# Patient Record
Sex: Female | Born: 1960 | Race: White | Hispanic: No | Marital: Single | Smoking: Never smoker
Health system: Southern US, Community
[De-identification: ages and names within clinical notes are randomized; demographics above are authoritative.]

## PROBLEM LIST (undated history)

## (undated) DIAGNOSIS — J45909 Unspecified asthma, uncomplicated: Secondary | ICD-10-CM

## (undated) DIAGNOSIS — F329 Major depressive disorder, single episode, unspecified: Secondary | ICD-10-CM

## (undated) DIAGNOSIS — F32A Depression, unspecified: Secondary | ICD-10-CM

## (undated) DIAGNOSIS — G4700 Insomnia, unspecified: Secondary | ICD-10-CM

## (undated) HISTORY — DX: Depression, unspecified: F32.A

## (undated) HISTORY — PX: COLONOSCOPY: SHX174

## (undated) HISTORY — PX: FOOT SURGERY: SHX648

## (undated) HISTORY — PX: ESOPHAGOGASTRODUODENOSCOPY: SHX1529

---
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
## 1993-05-03 HISTORY — PX: NISSEN FUNDOPLICATION: SHX2091

## 1998-12-01 ENCOUNTER — Other Ambulatory Visit: Admission: RE | Admit: 1998-12-01 | Discharge: 1998-12-01 | Payer: Self-pay | Admitting: *Deleted

## 2000-01-26 ENCOUNTER — Other Ambulatory Visit: Admission: RE | Admit: 2000-01-26 | Discharge: 2000-01-26 | Payer: Self-pay | Admitting: *Deleted

## 2001-01-19 ENCOUNTER — Other Ambulatory Visit: Admission: RE | Admit: 2001-01-19 | Discharge: 2001-01-19 | Payer: Self-pay | Admitting: Obstetrics and Gynecology

## 2002-06-04 ENCOUNTER — Other Ambulatory Visit: Admission: RE | Admit: 2002-06-04 | Discharge: 2002-06-04 | Payer: Self-pay | Admitting: Obstetrics and Gynecology

## 2003-06-24 ENCOUNTER — Other Ambulatory Visit: Admission: RE | Admit: 2003-06-24 | Discharge: 2003-06-24 | Payer: Self-pay | Admitting: Obstetrics and Gynecology

## 2015-07-28 ENCOUNTER — Ambulatory Visit
Admission: EM | Admit: 2015-07-28 | Discharge: 2015-07-28 | Disposition: A | Discharge status: Home / Self Care | Payer: Federal, State, Local not specified - PPO | Attending: Emergency Medicine | Admitting: Emergency Medicine

## 2015-07-28 ENCOUNTER — Encounter: Payer: Self-pay | Admitting: Emergency Medicine

## 2015-07-28 DIAGNOSIS — J0140 Acute pansinusitis, unspecified: Secondary | ICD-10-CM

## 2015-07-28 MED ORDER — MOMETASONE FUROATE 50 MCG/ACT NA SUSP: 2.0000 | Freq: Every day | NASAL | Status: DC

## 2015-07-28 MED ORDER — IBUPROFEN 800 MG PO TABS: 800.0000 mg | ORAL_TABLET | Freq: Three times a day (TID) | ORAL | Status: DC | PRN

## 2015-07-28 MED ORDER — PSEUDOEPHEDRINE-GUAIFENESIN ER 120-1200 MG PO TB12: 1.0000 | ORAL_TABLET | Freq: Two times a day (BID) | ORAL | Status: DC

## 2015-07-28 MED ORDER — AMOXICILLIN-POT CLAVULANATE 875-125 MG PO TABS: 1.0000 | ORAL_TABLET | Freq: Two times a day (BID) | ORAL | Status: DC

## 2015-07-28 NOTE — ED Notes (Signed)
Developed sinus pain and sore throat last Wednesday. Has drainage at night

## 2015-07-28 NOTE — ED Provider Notes (Signed)
HPI  SUBJECTIVE:  Claudina LickLori Kocourek is a 55 y.o. female who presents with sore throat, swollen glands, ears "popping", bilateral ear pain, nasal congestion, postnasal drip, frontal maxillary sinus pain/pressure for 6 days. States that she had upper respiratory like symptoms 3 weeks ago, and that she got slightly better but then got worse again 6 days ago. She states that her upper teeth hurt. Symptoms are worse with lying down, no alleviating factors. No nausea, vomiting, fevers, rhinorrhea. No otorrhea, hearing changes. No allergy symptoms. Reports occasional cough but no wheezing or shortness of breath. Reports raspy voice, no muffled hot potato voice. No drooling, trismus, neck stiffness. No antipyretic in past 4-6 hours. Symptoms are better with ice packs, worse with lying down. She has also tried heat on her face. No known contacts with strep or flu. 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 States that she usually gets a sore throat with sinus infections. No history of diabetes or hypertension. PMD: none  History reviewed. No pertinent past medical history.  Past Surgical History  Procedure Laterality Date  . Foot surgery      History reviewed. No pertinent family history.  Social History  Substance Use Topics  . Smoking status: Never Smoker   . Smokeless tobacco: None  . Alcohol Use: Yes    No current facility-administered medications for this encounter.  Current outpatient prescriptions:  .  desvenlafaxine (PRISTIQ) 100 MG 24 hr tablet, Take 100 mg by mouth daily., Disp: , Rfl:  .  zolpidem (AMBIEN) 10 MG tablet, Take 10 mg by mouth at bedtime as needed for sleep., Disp: , Rfl:  .  amoxicillin-clavulanate (AUGMENTIN) 875-125 MG tablet, Take 1 tablet by mouth 2 (two) times daily. X 7 days, Disp: 14 tablet, Rfl: 0 .  ibuprofen (ADVIL,MOTRIN) 800 MG tablet, Take 1 tablet (800 mg total) by mouth every 8 (eight) hours as needed., Disp: 30 tablet, Rfl: 0 .   mometasone (NASONEX) 50 MCG/ACT nasal spray, Place 2 sprays into the nose daily., Disp: 17 g, Rfl: 0 .  Pseudoephedrine-Guaifenesin (MUCINEX D) 720 859 6514 MG TB12, Take 1 tablet by mouth 2 (two) times daily., Disp: 20 each, Rfl: 0  No Known Allergies   ROS  As noted in HPI.   Physical Exam  BP 130/72 mmHg  Pulse 74  Temp(Src) 98.4 F (36.9 C) (Oral)  Resp 18  Ht 5\' 5"  (1.651 m)  Wt 185 lb (83.915 kg)  BMI 30.79 kg/m2  SpO2 97%  LMP  (LMP Unknown)  Constitutional: Well developed, well nourished, no acute distress Eyes:  EOMI, conjunctiva normal bilaterally HENT: Normocephalic, atraumatic,mucus membranes moist. TMs normal bilaterally. + purulent nasal congestion. Swollen  red turbinates. +  maxillary sinus tenderness, +  frontal sinus tenderness. Oropharynx erythematous +postnasal drip.  Neck: No cervical lymphadenopathy  Respiratory: Normal inspiratory effort Cardiovascular: Normal rate GI: nondistended skin: No rash, skin intact Musculoskeletal: no deformities Neurologic: Alert & oriented x 3, no focal neuro deficits Psychiatric: Speech and behavior appropriate   ED Course   Medications - No data to display  No orders of the defined types were placed in this encounter.    No results found for this or any previous visit (from the past 24 hour(s)). No results found.  ED Clinical Impression  Acute pansinusitis, recurrence not specified  ED Assessment/Plan   Pt with indications for abx due to double sickening and severe symptoms.. Will start augmentin,  in addition to nasal steriods, mucinex-d, saline nasal irrigation, increase  fluids, tylenol/motrin prn pain. Discussed MDM and plan with pt. Pt agrees with plan and will f/u with PMD of choice. Giving referral to [place] [facility]. *This clinic note was created using Dragon dictation software. Therefore, there may be occasional mistakes despite careful proofreading.  ?      Domenick Gong, MD 07/28/15  [phone]

## 2015-07-28 NOTE — Discharge Instructions (Signed)
Take the medication as written. Return if you get worse, have a fever >100.4, or for any concerns. You may take 800 mg of motrin with 1 gram of tylenol up to 3 times a day as needed for pain. This is an effective combination for pain. Use a neti pot or the NeilMed sinus rinse as often as you want to to reduce nasal congestion. Follow the directions on the box.  ° °Go to www.goodrx.com to look up your medications. This will give you a list of where you can find your prescriptions at the most affordable prices.  ° °

## 2015-12-26 ENCOUNTER — Other Ambulatory Visit: Payer: Self-pay | Admitting: Obstetrics and Gynecology

## 2015-12-26 DIAGNOSIS — Z1231 Encounter for screening mammogram for malignant neoplasm of breast: Secondary | ICD-10-CM

## 2017-01-31 ENCOUNTER — Other Ambulatory Visit: Payer: Self-pay | Admitting: Obstetrics and Gynecology

## 2017-01-31 DIAGNOSIS — Z1231 Encounter for screening mammogram for malignant neoplasm of breast: Secondary | ICD-10-CM

## 2017-12-28 ENCOUNTER — Other Ambulatory Visit: Payer: Self-pay

## 2017-12-28 ENCOUNTER — Ambulatory Visit
Admission: EM | Admit: 2017-12-28 | Discharge: 2017-12-28 | Disposition: A | Discharge status: Home / Self Care | Payer: Federal, State, Local not specified - PPO | Attending: Family Medicine | Admitting: Family Medicine

## 2017-12-28 ENCOUNTER — Encounter: Payer: Self-pay | Admitting: Emergency Medicine

## 2017-12-28 DIAGNOSIS — J0100 Acute maxillary sinusitis, unspecified: Secondary | ICD-10-CM

## 2017-12-28 DIAGNOSIS — H6591 Unspecified nonsuppurative otitis media, right ear: Secondary | ICD-10-CM

## 2017-12-28 HISTORY — DX: Insomnia, unspecified: G47.00

## 2017-12-28 LAB — RAPID STREP SCREEN (MED CTR MEBANE ONLY): STREPTOCOCCUS, GROUP A SCREEN (DIRECT): NEGATIVE

## 2017-12-28 MED ORDER — AMOXICILLIN-POT CLAVULANATE 875-125 MG PO TABS: 1.0000 | ORAL_TABLET | Freq: Two times a day (BID) | ORAL | 0 refills | Status: DC

## 2017-12-28 NOTE — Discharge Instructions (Addendum)
Take medication as prescribed. Rest. Drink plenty of fluids.  ° °Follow up with your primary care physician this week as needed. Return to Urgent care for new or worsening concerns.  ° °

## 2017-12-28 NOTE — ED Provider Notes (Signed)
[facility] ____________________________________________  Time seen: Approximately 10:21 AM  I have reviewed the triage vital signs and the nursing notes.   HISTORY  Chief Complaint Nasal Congestion; Sore Throat; and Lymphadenopathy   HPI Catherine Page is a 57 y.o. female presenting for evaluation of 1 week of nasal congestion, nasal drainage,sore throat, right ear discomfort and right-sided lymph node tenderness.  Patient stated that this started off with sensation of fever, but reports she is also having menopausal hot flashes and unsure if it was truly a fever or hot flashes.  States she then felt the nasal congestion and right-sided tenderness to her neck with ear discomfort.  Continues with nasal drainage.  States occasional cough, but denies chest congestion sensation.  Denies continued fever sensation.  Has been taking over-the-counter cough cold congestion decongestants without resolution.  Denies known sick contacts.  Has continue to remain active.  Decreased appetite, but continues to overall eat and drink well.  Denies other aggravating alleviating factors.  Reports otherwise feels well.  Denies chest pain, shortness of breath, abdominal pain, or rash. Denies recent sickness. Denies recent antibiotic use.    Past Medical History:  Diagnosis Date  . Insomnia     There are no active problems to display for this patient.   Past Surgical History:  Procedure Laterality Date  . FOOT SURGERY       No current facility-administered medications for this encounter.   Current Outpatient Medications:  .  zolpidem (AMBIEN) 10 MG tablet, Take 10 mg by mouth at bedtime as needed for sleep., Disp: , Rfl:  .  amoxicillin-clavulanate (AUGMENTIN) 875-125 MG tablet, Take 1 tablet by mouth every 12 (twelve) hours., Disp: 20 tablet, Rfl: 0  Allergies Patient has no known allergies.  Family History  Problem Relation Age of Onset  . Alzheimer's disease Mother   . Dementia  Mother   . Hypertension Mother   . Diabetes Mother   . Stroke Mother   . Other Father [id number]       committed suicide  . Heart attack Father   . Heart disease Father     Social History Social History   Tobacco Use  . Smoking status: Never Smoker  . Smokeless tobacco: Never Used  Substance Use Topics  . Alcohol use: Yes    Comment: rarely  . Drug use: Never    Review of Systems Constitutional: As above.  ENT: positive sore throat. As above. Cardiovascular: Denies chest pain. Respiratory: Denies shortness of breath. Gastrointestinal: No abdominal pain.  Musculoskeletal: Negative for back pain. Skin: Negative for rash.  ____________________________________________   PHYSICAL EXAM:  VITAL SIGNS: ED Triage Vitals  Enc Vitals Group     BP 12/28/17 0935 128/69     Pulse Rate 12/28/17 0935 76     Resp 12/28/17 0935 16     Temp 12/28/17 0935 98.5 F (36.9 C)     Temp Source 12/28/17 0935 Oral     SpO2 12/28/17 0935 99 %     Weight 12/28/17 0934 170 lb (77.1 kg)     Height 12/28/17 0934 5\' 4"  (1.626 m)     Head Circumference --      Peak Flow --      Pain Score 12/28/17 0934 7     Pain Loc --      Pain Edu? --      Excl. in GC? --     Constitutional: Alert and oriented. Well appearing and in no acute distress. Eyes:  Conjunctivae are normal.  Head: Atraumatic.Mild tenderness to palpation bilateral maxillary sinuses.  No frontal sinus tenderness palpation.  No swelling. No erythema.   Ears: Left: Nontender, normal canal, no erythema, normal TM.  Right: Nontender, normal canal, moderate erythema, effusion present, no drainage.  No mastoid tenderness bilaterally.  Nose: nasal congestion with bilateral nasal turbinate erythema and edema.   Mouth/Throat: Mucous membranes are moist.  Oropharynx non-erythematous.No tonsillar swelling or exudate.  Neck: No stridor.  No cervical spine tenderness to palpation. Hematological/Lymphatic/Immunilogical: Mild right sided cervical  lymphadenopathy, no left. Cardiovascular: Normal rate, regular rhythm. Grossly normal heart sounds.  Good peripheral circulation. Respiratory: Normal respiratory effort.  No retractions.No wheezes, rales or rhonchi. Good air movement.  Musculoskeletal: Steady gait. Neurologic:  Normal speech and language. No gait instability. Skin:  Skin is warm, dry and intact. No rash noted. Psychiatric: Mood and affect are normal. Speech and behavior are normal.  ___________________________________________   LABS (all labs ordered are listed, but only abnormal results are displayed)  Labs Reviewed  RAPID STREP SCREEN ([facility] ONLY)  CULTURE, GROUP A STREP [facility])   ____________________________________________ PROCEDURES Procedures    INITIAL IMPRESSION / ASSESSMENT AND PLAN / ED COURSE  Pertinent labs & imaging results that were available during my care of the patient were reviewed by me and considered in my medical decision making (see chart for details).  Well-appearing patient.  No acute distress.  Strep negative.  Suspect recent viral upper respiratory infection, now with right otitis with effusion and concern for secondary sinusitis.  Will treat with oral Augmentin.  Encouraged rest, fluids, supportive care and decongestants.Discussed indication, risks and benefits of medications with patient.  Discussed follow up with Primary care physician this week. Discussed follow up and return parameters including no resolution or any worsening concerns. Patient verbalized understanding and agreed to plan.   ____________________________________________   FINAL CLINICAL IMPRESSION(S) / ED DIAGNOSES  Final diagnoses:  Right otitis media with effusion  Acute non-recurrent maxillary sinusitis     ED Discharge Orders         Ordered    amoxicillin-clavulanate (AUGMENTIN) 875-125 MG tablet  Every 12 hours     12/28/17 1012           Note: This dictation was prepared with Dragon  dictation along with smaller phrase technology. Any transcriptional errors that result from this process are unintentional.         Renford Dills, NP 12/28/17 1024

## 2017-12-28 NOTE — ED Triage Notes (Signed)
Patient in today c/o 1 week history of nasal congestion, sore throat and swollen gland on the right side of her neck with pain going into her right ear. Patient states she has felt feverish and has had chills, but her thermometer was broken. Patient has tried OTC decongestant, Tylenol and expectorant.

## 2017-12-30 LAB — CULTURE, GROUP A STREP (THRC)

## 2018-01-09 ENCOUNTER — Ambulatory Visit
Admission: RE | Admit: 2018-01-09 | Discharge: 2018-01-09 | Disposition: A | Discharge status: Home / Self Care | Payer: Federal, State, Local not specified - PPO | Source: Ambulatory Visit | Attending: Obstetrics and Gynecology | Admitting: Obstetrics and Gynecology

## 2018-01-09 ENCOUNTER — Encounter (INDEPENDENT_AMBULATORY_CARE_PROVIDER_SITE_OTHER): Payer: Self-pay

## 2018-01-09 DIAGNOSIS — Z1231 Encounter for screening mammogram for malignant neoplasm of breast: Secondary | ICD-10-CM | POA: Insufficient documentation

## 2018-02-02 DIAGNOSIS — N951 Menopausal and female climacteric states: Secondary | ICD-10-CM | POA: Insufficient documentation

## 2018-02-21 DIAGNOSIS — M7670 Peroneal tendinitis, unspecified leg: Secondary | ICD-10-CM | POA: Insufficient documentation

## 2018-02-21 DIAGNOSIS — M25373 Other instability, unspecified ankle: Secondary | ICD-10-CM | POA: Insufficient documentation

## 2018-02-21 DIAGNOSIS — M879 Osteonecrosis, unspecified: Secondary | ICD-10-CM | POA: Insufficient documentation

## 2018-02-21 DIAGNOSIS — Q66219 Congenital metatarsus primus varus, unspecified foot: Secondary | ICD-10-CM | POA: Insufficient documentation

## 2018-02-21 DIAGNOSIS — M19079 Primary osteoarthritis, unspecified ankle and foot: Secondary | ICD-10-CM | POA: Insufficient documentation

## 2018-02-21 DIAGNOSIS — M7740 Metatarsalgia, unspecified foot: Secondary | ICD-10-CM | POA: Insufficient documentation

## 2018-02-21 DIAGNOSIS — M2010 Hallux valgus (acquired), unspecified foot: Secondary | ICD-10-CM | POA: Insufficient documentation

## 2018-05-03 HISTORY — PX: SHOULDER SURGERY: SHX246

## 2018-05-16 DIAGNOSIS — F332 Major depressive disorder, recurrent severe without psychotic features: Secondary | ICD-10-CM | POA: Diagnosis not present

## 2018-05-16 DIAGNOSIS — Z79899 Other long term (current) drug therapy: Secondary | ICD-10-CM | POA: Diagnosis not present

## 2018-05-17 DIAGNOSIS — M13812 Other specified arthritis, left shoulder: Secondary | ICD-10-CM | POA: Diagnosis not present

## 2018-05-17 DIAGNOSIS — S43432A Superior glenoid labrum lesion of left shoulder, initial encounter: Secondary | ICD-10-CM | POA: Diagnosis not present

## 2018-05-17 DIAGNOSIS — M7542 Impingement syndrome of left shoulder: Secondary | ICD-10-CM | POA: Diagnosis not present

## 2018-05-25 DIAGNOSIS — M13812 Other specified arthritis, left shoulder: Secondary | ICD-10-CM | POA: Diagnosis not present

## 2018-05-25 DIAGNOSIS — G8918 Other acute postprocedural pain: Secondary | ICD-10-CM | POA: Diagnosis not present

## 2018-05-25 DIAGNOSIS — M7542 Impingement syndrome of left shoulder: Secondary | ICD-10-CM | POA: Diagnosis not present

## 2018-05-25 DIAGNOSIS — S43432A Superior glenoid labrum lesion of left shoulder, initial encounter: Secondary | ICD-10-CM | POA: Diagnosis not present

## 2018-05-25 DIAGNOSIS — M19012 Primary osteoarthritis, left shoulder: Secondary | ICD-10-CM | POA: Diagnosis not present

## 2018-05-25 DIAGNOSIS — M25512 Pain in left shoulder: Secondary | ICD-10-CM | POA: Diagnosis not present

## 2018-05-25 DIAGNOSIS — M25812 Other specified joint disorders, left shoulder: Secondary | ICD-10-CM | POA: Diagnosis not present

## 2018-05-25 DIAGNOSIS — M75112 Incomplete rotator cuff tear or rupture of left shoulder, not specified as traumatic: Secondary | ICD-10-CM | POA: Diagnosis not present

## 2018-05-25 DIAGNOSIS — M7522 Bicipital tendinitis, left shoulder: Secondary | ICD-10-CM | POA: Diagnosis not present

## 2018-05-30 DIAGNOSIS — M25512 Pain in left shoulder: Secondary | ICD-10-CM | POA: Diagnosis not present

## 2018-05-30 DIAGNOSIS — M25612 Stiffness of left shoulder, not elsewhere classified: Secondary | ICD-10-CM | POA: Diagnosis not present

## 2018-06-09 DIAGNOSIS — M25612 Stiffness of left shoulder, not elsewhere classified: Secondary | ICD-10-CM | POA: Diagnosis not present

## 2018-06-09 DIAGNOSIS — M25512 Pain in left shoulder: Secondary | ICD-10-CM | POA: Diagnosis not present

## 2018-06-12 DIAGNOSIS — M25612 Stiffness of left shoulder, not elsewhere classified: Secondary | ICD-10-CM | POA: Diagnosis not present

## 2018-06-12 DIAGNOSIS — M25512 Pain in left shoulder: Secondary | ICD-10-CM | POA: Diagnosis not present

## 2018-06-13 DIAGNOSIS — F332 Major depressive disorder, recurrent severe without psychotic features: Secondary | ICD-10-CM | POA: Diagnosis not present

## 2018-06-15 DIAGNOSIS — F331 Major depressive disorder, recurrent, moderate: Secondary | ICD-10-CM | POA: Diagnosis not present

## 2018-06-20 DIAGNOSIS — F332 Major depressive disorder, recurrent severe without psychotic features: Secondary | ICD-10-CM | POA: Diagnosis not present

## 2018-06-22 DIAGNOSIS — F332 Major depressive disorder, recurrent severe without psychotic features: Secondary | ICD-10-CM | POA: Diagnosis not present

## 2018-06-26 DIAGNOSIS — F332 Major depressive disorder, recurrent severe without psychotic features: Secondary | ICD-10-CM | POA: Diagnosis not present

## 2018-06-28 DIAGNOSIS — F331 Major depressive disorder, recurrent, moderate: Secondary | ICD-10-CM | POA: Diagnosis not present

## 2018-07-03 DIAGNOSIS — F331 Major depressive disorder, recurrent, moderate: Secondary | ICD-10-CM | POA: Diagnosis not present

## 2018-07-05 DIAGNOSIS — F331 Major depressive disorder, recurrent, moderate: Secondary | ICD-10-CM | POA: Diagnosis not present

## 2018-07-12 DIAGNOSIS — M25512 Pain in left shoulder: Secondary | ICD-10-CM | POA: Diagnosis not present

## 2018-07-12 DIAGNOSIS — M25612 Stiffness of left shoulder, not elsewhere classified: Secondary | ICD-10-CM | POA: Diagnosis not present

## 2018-07-13 DIAGNOSIS — F331 Major depressive disorder, recurrent, moderate: Secondary | ICD-10-CM | POA: Diagnosis not present

## 2018-07-17 DIAGNOSIS — F332 Major depressive disorder, recurrent severe without psychotic features: Secondary | ICD-10-CM | POA: Diagnosis not present

## 2018-07-25 DIAGNOSIS — G4700 Insomnia, unspecified: Secondary | ICD-10-CM | POA: Diagnosis not present

## 2018-07-25 DIAGNOSIS — Z79899 Other long term (current) drug therapy: Secondary | ICD-10-CM | POA: Diagnosis not present

## 2018-07-25 DIAGNOSIS — F902 Attention-deficit hyperactivity disorder, combined type: Secondary | ICD-10-CM | POA: Diagnosis not present

## 2018-07-25 DIAGNOSIS — F331 Major depressive disorder, recurrent, moderate: Secondary | ICD-10-CM | POA: Diagnosis not present

## 2018-08-07 DIAGNOSIS — G4700 Insomnia, unspecified: Secondary | ICD-10-CM | POA: Diagnosis not present

## 2018-08-07 DIAGNOSIS — F331 Major depressive disorder, recurrent, moderate: Secondary | ICD-10-CM | POA: Diagnosis not present

## 2018-08-07 DIAGNOSIS — F902 Attention-deficit hyperactivity disorder, combined type: Secondary | ICD-10-CM | POA: Diagnosis not present

## 2018-09-21 DIAGNOSIS — F330 Major depressive disorder, recurrent, mild: Secondary | ICD-10-CM | POA: Diagnosis not present

## 2018-10-17 DIAGNOSIS — G4700 Insomnia, unspecified: Secondary | ICD-10-CM | POA: Diagnosis not present

## 2018-10-17 DIAGNOSIS — F331 Major depressive disorder, recurrent, moderate: Secondary | ICD-10-CM | POA: Diagnosis not present

## 2018-10-17 DIAGNOSIS — F902 Attention-deficit hyperactivity disorder, combined type: Secondary | ICD-10-CM | POA: Diagnosis not present

## 2018-10-26 DIAGNOSIS — F330 Major depressive disorder, recurrent, mild: Secondary | ICD-10-CM | POA: Diagnosis not present

## 2018-11-10 DIAGNOSIS — F331 Major depressive disorder, recurrent, moderate: Secondary | ICD-10-CM | POA: Diagnosis not present

## 2018-12-06 DIAGNOSIS — F330 Major depressive disorder, recurrent, mild: Secondary | ICD-10-CM | POA: Diagnosis not present

## 2019-01-29 DIAGNOSIS — F331 Major depressive disorder, recurrent, moderate: Secondary | ICD-10-CM | POA: Diagnosis not present

## 2019-01-29 DIAGNOSIS — G4700 Insomnia, unspecified: Secondary | ICD-10-CM | POA: Diagnosis not present

## 2019-01-29 DIAGNOSIS — F902 Attention-deficit hyperactivity disorder, combined type: Secondary | ICD-10-CM | POA: Diagnosis not present

## 2019-01-31 DIAGNOSIS — F330 Major depressive disorder, recurrent, mild: Secondary | ICD-10-CM | POA: Diagnosis not present

## 2019-02-01 ENCOUNTER — Other Ambulatory Visit: Payer: Self-pay | Admitting: Obstetrics and Gynecology

## 2019-02-01 DIAGNOSIS — Z1231 Encounter for screening mammogram for malignant neoplasm of breast: Secondary | ICD-10-CM

## 2019-02-07 DIAGNOSIS — M5432 Sciatica, left side: Secondary | ICD-10-CM | POA: Diagnosis not present

## 2019-02-07 DIAGNOSIS — M9902 Segmental and somatic dysfunction of thoracic region: Secondary | ICD-10-CM | POA: Diagnosis not present

## 2019-02-07 DIAGNOSIS — M546 Pain in thoracic spine: Secondary | ICD-10-CM | POA: Diagnosis not present

## 2019-02-07 DIAGNOSIS — M9903 Segmental and somatic dysfunction of lumbar region: Secondary | ICD-10-CM | POA: Diagnosis not present

## 2019-02-08 DIAGNOSIS — M5432 Sciatica, left side: Secondary | ICD-10-CM | POA: Diagnosis not present

## 2019-02-08 DIAGNOSIS — M9902 Segmental and somatic dysfunction of thoracic region: Secondary | ICD-10-CM | POA: Diagnosis not present

## 2019-02-08 DIAGNOSIS — M9903 Segmental and somatic dysfunction of lumbar region: Secondary | ICD-10-CM | POA: Diagnosis not present

## 2019-02-08 DIAGNOSIS — M546 Pain in thoracic spine: Secondary | ICD-10-CM | POA: Diagnosis not present

## 2019-02-12 DIAGNOSIS — M546 Pain in thoracic spine: Secondary | ICD-10-CM | POA: Diagnosis not present

## 2019-02-12 DIAGNOSIS — M9903 Segmental and somatic dysfunction of lumbar region: Secondary | ICD-10-CM | POA: Diagnosis not present

## 2019-02-12 DIAGNOSIS — M5432 Sciatica, left side: Secondary | ICD-10-CM | POA: Diagnosis not present

## 2019-02-12 DIAGNOSIS — M9902 Segmental and somatic dysfunction of thoracic region: Secondary | ICD-10-CM | POA: Diagnosis not present

## 2019-02-14 DIAGNOSIS — M9902 Segmental and somatic dysfunction of thoracic region: Secondary | ICD-10-CM | POA: Diagnosis not present

## 2019-02-14 DIAGNOSIS — M546 Pain in thoracic spine: Secondary | ICD-10-CM | POA: Diagnosis not present

## 2019-02-14 DIAGNOSIS — M5432 Sciatica, left side: Secondary | ICD-10-CM | POA: Diagnosis not present

## 2019-02-14 DIAGNOSIS — M9903 Segmental and somatic dysfunction of lumbar region: Secondary | ICD-10-CM | POA: Diagnosis not present

## 2019-02-19 DIAGNOSIS — M9903 Segmental and somatic dysfunction of lumbar region: Secondary | ICD-10-CM | POA: Diagnosis not present

## 2019-02-19 DIAGNOSIS — M9902 Segmental and somatic dysfunction of thoracic region: Secondary | ICD-10-CM | POA: Diagnosis not present

## 2019-02-19 DIAGNOSIS — M546 Pain in thoracic spine: Secondary | ICD-10-CM | POA: Diagnosis not present

## 2019-02-19 DIAGNOSIS — M5432 Sciatica, left side: Secondary | ICD-10-CM | POA: Diagnosis not present

## 2019-02-27 DIAGNOSIS — F330 Major depressive disorder, recurrent, mild: Secondary | ICD-10-CM | POA: Diagnosis not present

## 2019-03-27 DIAGNOSIS — F330 Major depressive disorder, recurrent, mild: Secondary | ICD-10-CM | POA: Diagnosis not present

## 2019-04-25 DIAGNOSIS — F330 Major depressive disorder, recurrent, mild: Secondary | ICD-10-CM | POA: Diagnosis not present

## 2019-05-02 DIAGNOSIS — F902 Attention-deficit hyperactivity disorder, combined type: Secondary | ICD-10-CM | POA: Diagnosis not present

## 2019-05-02 DIAGNOSIS — F330 Major depressive disorder, recurrent, mild: Secondary | ICD-10-CM | POA: Diagnosis not present

## 2019-05-02 DIAGNOSIS — G4700 Insomnia, unspecified: Secondary | ICD-10-CM | POA: Diagnosis not present

## 2019-05-16 DIAGNOSIS — F330 Major depressive disorder, recurrent, mild: Secondary | ICD-10-CM | POA: Diagnosis not present

## 2019-06-25 DIAGNOSIS — F330 Major depressive disorder, recurrent, mild: Secondary | ICD-10-CM | POA: Diagnosis not present

## 2019-07-03 DIAGNOSIS — F330 Major depressive disorder, recurrent, mild: Secondary | ICD-10-CM | POA: Diagnosis not present

## 2019-07-11 DIAGNOSIS — M278 Other specified diseases of jaws: Secondary | ICD-10-CM | POA: Diagnosis not present

## 2019-07-11 DIAGNOSIS — K0500 Acute gingivitis, plaque induced: Secondary | ICD-10-CM | POA: Diagnosis not present

## 2019-07-11 DIAGNOSIS — K148 Other diseases of tongue: Secondary | ICD-10-CM | POA: Diagnosis not present

## 2019-07-12 ENCOUNTER — Ambulatory Visit
Admission: RE | Admit: 2019-07-12 | Discharge: 2019-07-12 | Disposition: A | Discharge status: Home / Self Care | Payer: Federal, State, Local not specified - PPO | Source: Ambulatory Visit | Attending: Obstetrics and Gynecology | Admitting: Obstetrics and Gynecology

## 2019-07-12 ENCOUNTER — Other Ambulatory Visit: Payer: Self-pay

## 2019-07-12 DIAGNOSIS — Z1231 Encounter for screening mammogram for malignant neoplasm of breast: Secondary | ICD-10-CM

## 2019-07-17 DIAGNOSIS — F330 Major depressive disorder, recurrent, mild: Secondary | ICD-10-CM | POA: Diagnosis not present

## 2019-07-25 DIAGNOSIS — G4700 Insomnia, unspecified: Secondary | ICD-10-CM | POA: Diagnosis not present

## 2019-07-25 DIAGNOSIS — F902 Attention-deficit hyperactivity disorder, combined type: Secondary | ICD-10-CM | POA: Diagnosis not present

## 2019-07-25 DIAGNOSIS — F331 Major depressive disorder, recurrent, moderate: Secondary | ICD-10-CM | POA: Diagnosis not present

## 2019-08-07 DIAGNOSIS — F32A Depression, unspecified: Secondary | ICD-10-CM | POA: Insufficient documentation

## 2019-08-07 DIAGNOSIS — Z1231 Encounter for screening mammogram for malignant neoplasm of breast: Secondary | ICD-10-CM | POA: Diagnosis not present

## 2019-08-07 DIAGNOSIS — F329 Major depressive disorder, single episode, unspecified: Secondary | ICD-10-CM | POA: Insufficient documentation

## 2019-08-07 DIAGNOSIS — Z124 Encounter for screening for malignant neoplasm of cervix: Secondary | ICD-10-CM | POA: Diagnosis not present

## 2019-08-07 DIAGNOSIS — Z1151 Encounter for screening for human papillomavirus (HPV): Secondary | ICD-10-CM | POA: Diagnosis not present

## 2019-08-07 DIAGNOSIS — Z01419 Encounter for gynecological examination (general) (routine) without abnormal findings: Secondary | ICD-10-CM | POA: Diagnosis not present

## 2019-08-08 LAB — HM PAP SMEAR: HM Pap smear: NORMAL

## 2019-08-13 ENCOUNTER — Ambulatory Visit: Payer: Federal, State, Local not specified - PPO

## 2019-08-14 DIAGNOSIS — F330 Major depressive disorder, recurrent, mild: Secondary | ICD-10-CM | POA: Diagnosis not present

## 2019-08-16 ENCOUNTER — Ambulatory Visit: Payer: Federal, State, Local not specified - PPO | Admitting: Family Medicine

## 2019-08-16 ENCOUNTER — Other Ambulatory Visit: Payer: Self-pay

## 2019-08-16 ENCOUNTER — Encounter: Payer: Self-pay | Admitting: Family Medicine

## 2019-08-16 VITALS — BP 110/80 | HR 76 | Ht 64.0 in | Wt 240.0 lb

## 2019-08-16 DIAGNOSIS — Z1211 Encounter for screening for malignant neoplasm of colon: Secondary | ICD-10-CM | POA: Diagnosis not present

## 2019-08-16 DIAGNOSIS — E663 Overweight: Secondary | ICD-10-CM | POA: Diagnosis not present

## 2019-08-16 DIAGNOSIS — Z7689 Persons encountering health services in other specified circumstances: Secondary | ICD-10-CM | POA: Diagnosis not present

## 2019-08-16 NOTE — Patient Instructions (Signed)

## 2019-08-16 NOTE — Progress Notes (Signed)
Date:  08/16/2019   Name:  Catherine Page   DOB:  Sep 07, 1960   MRN:  [id number]   Chief Complaint: Establish Care and Colon Cancer Screening (needs referral to [facility])  Patient is a 59 year old female who presents for a establish care exam. The patient reports the following problems: needs colonoscopy. Health maintenance has been reviewed up to date.   No results found for: CREATININE, BUN, NA, K, CL, CO2 No results found for: CHOL, HDL, LDLCALC, LDLDIRECT, TRIG, CHOLHDL No results found for: TSH No results found for: HGBA1C No results found for: WBC, HGB, HCT, MCV, PLT No results found for: ALT, AST, GGT, ALKPHOS, BILITOT   Review of Systems  Constitutional: Negative.  Negative for chills, fatigue, fever and unexpected weight change.  HENT: Negative for congestion, ear discharge, ear pain, rhinorrhea, sinus pressure, sneezing and sore throat.   Eyes: Negative for photophobia, pain, discharge, redness and itching.  Respiratory: Negative for cough, shortness of breath, wheezing and stridor.   Gastrointestinal: Negative for abdominal pain, blood in stool, constipation, diarrhea, nausea and vomiting.  Endocrine: Negative for cold intolerance, heat intolerance, polydipsia, polyphagia and polyuria.  Genitourinary: Negative for dysuria, flank pain, frequency, hematuria, menstrual problem, pelvic pain, urgency, vaginal bleeding and vaginal discharge.  Musculoskeletal: Negative for arthralgias, back pain and myalgias.  Skin: Negative for rash.  Allergic/Immunologic: Negative for environmental allergies and food allergies.  Neurological: Negative for dizziness, weakness, light-headedness, numbness and headaches.  Hematological: Negative for adenopathy. Does not bruise/bleed easily.  Psychiatric/Behavioral: Negative for dysphoric mood. The patient is not nervous/anxious.     There are no problems to display for this patient.   No Known Allergies  Past Surgical History:  Procedure Laterality  Date  . FOOT SURGERY    . SHOULDER SURGERY Left 05/03/2018    Social History   Tobacco Use  . Smoking status: Never Smoker  . Smokeless tobacco: Never Used  Substance Use Topics  . Alcohol use: Not Currently    Comment: rarely  . Drug use: Never     Medication list has been reviewed and updated.  Current Meds  Medication Sig  . alprazolam (XANAX) 2 MG tablet Take 2 mg by mouth at bedtime as needed for sleep. CBC  . amphetamine-dextroamphetamine (ADDERALL) 20 MG tablet Take 20 mg by mouth daily. CBC  . ARIPiprazole (ABILIFY) 2 MG tablet Take 2 mg by mouth daily. CBC  . desvenlafaxine (PRISTIQ) 100 MG 24 hr tablet Take 100 mg by mouth daily. CBC  . Esketamine HCl (SPRAVATO, 84 MG DOSE, NA) Place 1 Inhaler into the nose every 30 (thirty) days. CBC  . zolpidem (AMBIEN) 10 MG tablet Take 12.5 mg by mouth at bedtime as needed for sleep. CBC    PHQ 2/9 Scores 08/16/2019  PHQ - 2 Score 0  PHQ- 9 Score 1    BP Readings from Last 3 Encounters:  08/16/19 110/80  12/28/17 128/69  07/28/15 130/72    Physical Exam Vitals and nursing note reviewed.  Constitutional:      General: She is not in acute distress.    Appearance: She is not diaphoretic.  HENT:     Head: Normocephalic and atraumatic.     Right Ear: Tympanic membrane, ear canal and external ear normal. There is no impacted cerumen.     Left Ear: Tympanic membrane, ear canal and external ear normal. There is no impacted cerumen.     Nose: Nose normal. No congestion or rhinorrhea.  Mouth/Throat:     Mouth: Mucous membranes are moist.  Eyes:     General:        Right eye: No discharge.        Left eye: No discharge.     Conjunctiva/sclera: Conjunctivae normal.     Pupils: Pupils are equal, round, and reactive to light.  Neck:     Thyroid: No thyromegaly.     Vascular: No carotid bruit or JVD.  Cardiovascular:     Rate and Rhythm: Normal rate and regular rhythm.     Pulses: Normal pulses.     Heart sounds:  Normal heart sounds. No murmur. No friction rub. No gallop.   Pulmonary:     Effort: Pulmonary effort is normal.     Breath sounds: Normal breath sounds. No wheezing, rhonchi or rales.  Abdominal:     General: Bowel sounds are normal.     Palpations: Abdomen is soft. There is no mass.     Tenderness: There is no abdominal tenderness. There is no guarding.  Musculoskeletal:        General: Normal range of motion.     Cervical back: Normal range of motion and neck supple. No rigidity or tenderness.  Lymphadenopathy:     Cervical: No cervical adenopathy.  Skin:    General: Skin is warm and dry.     Capillary Refill: Capillary refill takes less than 2 seconds.     Coloration: Skin is not jaundiced or pale.     Findings: No bruising, erythema, lesion or rash.  Neurological:     Mental Status: She is alert.     Motor: No weakness.     Coordination: Coordination normal.     Deep Tendon Reflexes: Reflexes are normal and symmetric.  Psychiatric:        Mood and Affect: Mood normal.     Wt Readings from Last 3 Encounters:  08/16/19 240 lb (108.9 kg)  12/28/17 170 lb (77.1 kg)  07/28/15 185 lb (83.9 kg)    BP 110/80   Pulse 76   Ht 5\' 4"  (1.626 m)   Wt 240 lb (108.9 kg)   LMP  (LMP Unknown)   BMI 41.20 kg/m   Assessment and Plan:  1. Establishing care with new doctor, encounter for Patient establishing care with new physician.  Patient's previous encounters and care everywhere were reviewed.  We will establish care and will see in the next 6 months for recheck.  GYN maintenance done at [facility] clinic and patient will be screened for colonoscopy as requested.  2. Overweight Health risks of being over weight were discussed and patient was counseled on weight loss options and exercise.  Patient given diet sheet and encouraged to decrease to 1500-calorie diet  3. Colon cancer screening Patient has a history of abnormal polyps that is requiring colonoscopy every 5 years.  We have  signed a release of information to be sent to the gastroenterologist in [place] in the meantime we will put in a referral to [facility] gastroenterology for evaluation of and possible colonoscopy. - Ambulatory referral to Gastroenterology

## 2019-09-10 ENCOUNTER — Other Ambulatory Visit: Payer: Self-pay

## 2019-09-10 NOTE — Progress Notes (Unsigned)
Colonoscopy put in

## 2019-09-11 DIAGNOSIS — F330 Major depressive disorder, recurrent, mild: Secondary | ICD-10-CM | POA: Diagnosis not present

## 2019-10-09 DIAGNOSIS — F330 Major depressive disorder, recurrent, mild: Secondary | ICD-10-CM | POA: Diagnosis not present

## 2019-10-16 ENCOUNTER — Telehealth: Payer: Self-pay

## 2019-10-16 NOTE — Telephone Encounter (Signed)
Left a message for pt to return our call to see if she would do a colonoscopy or a FIT test

## 2019-10-17 DIAGNOSIS — F902 Attention-deficit hyperactivity disorder, combined type: Secondary | ICD-10-CM | POA: Diagnosis not present

## 2019-10-17 DIAGNOSIS — F331 Major depressive disorder, recurrent, moderate: Secondary | ICD-10-CM | POA: Diagnosis not present

## 2019-10-17 DIAGNOSIS — F332 Major depressive disorder, recurrent severe without psychotic features: Secondary | ICD-10-CM | POA: Diagnosis not present

## 2019-10-17 DIAGNOSIS — F330 Major depressive disorder, recurrent, mild: Secondary | ICD-10-CM | POA: Diagnosis not present

## 2019-12-05 DIAGNOSIS — F330 Major depressive disorder, recurrent, mild: Secondary | ICD-10-CM | POA: Diagnosis not present

## 2020-01-24 DIAGNOSIS — Z1281 Encounter for screening for malignant neoplasm of oral cavity: Secondary | ICD-10-CM | POA: Diagnosis not present

## 2020-01-24 DIAGNOSIS — K0510 Chronic gingivitis, plaque induced: Secondary | ICD-10-CM | POA: Diagnosis not present

## 2020-02-13 DIAGNOSIS — F330 Major depressive disorder, recurrent, mild: Secondary | ICD-10-CM | POA: Diagnosis not present

## 2020-02-14 DIAGNOSIS — F331 Major depressive disorder, recurrent, moderate: Secondary | ICD-10-CM | POA: Diagnosis not present

## 2020-02-14 DIAGNOSIS — F330 Major depressive disorder, recurrent, mild: Secondary | ICD-10-CM | POA: Diagnosis not present

## 2020-02-14 DIAGNOSIS — F902 Attention-deficit hyperactivity disorder, combined type: Secondary | ICD-10-CM | POA: Diagnosis not present

## 2020-02-14 DIAGNOSIS — F332 Major depressive disorder, recurrent severe without psychotic features: Secondary | ICD-10-CM | POA: Diagnosis not present

## 2020-02-14 DIAGNOSIS — Z79899 Other long term (current) drug therapy: Secondary | ICD-10-CM | POA: Diagnosis not present

## 2020-03-10 DIAGNOSIS — M2011 Hallux valgus (acquired), right foot: Secondary | ICD-10-CM | POA: Diagnosis not present

## 2020-03-10 DIAGNOSIS — M79672 Pain in left foot: Secondary | ICD-10-CM | POA: Diagnosis not present

## 2020-03-10 DIAGNOSIS — M7741 Metatarsalgia, right foot: Secondary | ICD-10-CM | POA: Diagnosis not present

## 2020-03-10 DIAGNOSIS — M79671 Pain in right foot: Secondary | ICD-10-CM | POA: Diagnosis not present

## 2020-04-14 ENCOUNTER — Ambulatory Visit: Payer: Self-pay

## 2020-04-14 NOTE — Telephone Encounter (Signed)
Patient called stating tht sh has had constant abdominal pain cent to left side since Thanksgiving.  She states that thanksgiving she was doubled over with pain.  She states that she has tried everything but she realized today that she has had constant abdominal pain sinec thanksgiving.  She states that it is normally 4-5 range and can go to a 7-8.  She stats that center she she feels sore.  She has hx of IBS but states this pain is not IBS.  She states that her BM's are not dark or black   She has not vomited.  She states her IBs ten to be constipated but have been a looser consistency. She states that she has been sleeping uncomfortably and just realized that it is stomach pain that is causing her restlessness. She feels the pain move when she turns.  She has treated herself for gas but no OTC med she has used as had any effect.Per protocol appointment scheduled for am. Care advice was read to patient. She verbalized understanging   Reason for Disposition . [1] MODERATE pain (e.g., interferes with normal activities) AND [2] pain comes and goes (cramps) AND [3] present > 24 hours  (Exception: pain with Vomiting or Diarrhea - see that Guideline)  Answer Assessment - Initial Assessment Questions 1. LOCATION: "Where does it hurt?"      Whole abdomin ,umbilicus 2. RADIATION: "Does the pain shoot anywhere else?" (e.g., chest, back)     Under breast middle to back 3. ONSET: "When did the pain begin?" (e.g., minutes, hours or days ago)    Before thanksgiving several hours 4. SUDDEN: "Gradual or sudden onset?"     Gradual  5. PATTERN "Does the pain come and go, or is it constant?"    - If constant: "Is it getting better, staying the same, or worsening?"      (Note: Constant means the pain never goes away completely; most serious pain is constant and it progresses)     - If intermittent: "How long does it last?" "Do you have pain now?"     (Note: Intermittent means the pain goes away completely between  bouts)    constant 6. SEVERITY: "How bad is the pain?"  (e.g., Scale 1-10; mild, moderate, or severe)   - MILD (1-3): doesn't interfere with normal activities, abdomen soft and not tender to touch    - MODERATE (4-7): interferes with normal activities or awakens from sleep, tender to touch    - SEVERE (8-10): excruciating pain, doubled over, unable to do any normal activities      4-5 and can get to 7-8 7. RECURRENT SYMPTOM: "Have you ever had this type of stomach pain before?" If Yes, ask: "When was the last time?" and "What happened that time?"    No tender to touch in middle 8. CAUSE: "What do you think is causing the stomach pain?"     unsure 9. RELIEVING/AGGRAVATING FACTORS: "What makes it better or worse?" (e.g., movement, antacids, bowel movement)    nothing 10. OTHER SYMPTOMS: "Has there been any vomiting, diarrhea, constipation, or urine problems?"      IBS Hx   11. PREGNANCY: "Is there any chance you are pregnant?" "When was your last menstrual period?"      N/A pass menopause  Protocols used: ABDOMINAL PAIN - [facility]

## 2020-04-15 ENCOUNTER — Other Ambulatory Visit: Payer: Self-pay

## 2020-04-15 ENCOUNTER — Ambulatory Visit (INDEPENDENT_AMBULATORY_CARE_PROVIDER_SITE_OTHER): Payer: Federal, State, Local not specified - PPO | Admitting: Family Medicine

## 2020-04-15 ENCOUNTER — Encounter: Payer: Self-pay | Admitting: Family Medicine

## 2020-04-15 VITALS — BP 120/80 | HR 84 | Ht 64.0 in | Wt 237.0 lb

## 2020-04-15 DIAGNOSIS — R1013 Epigastric pain: Secondary | ICD-10-CM | POA: Diagnosis not present

## 2020-04-15 DIAGNOSIS — K2960 Other gastritis without bleeding: Secondary | ICD-10-CM

## 2020-04-15 DIAGNOSIS — M25512 Pain in left shoulder: Secondary | ICD-10-CM | POA: Diagnosis not present

## 2020-04-15 DIAGNOSIS — Z23 Encounter for immunization: Secondary | ICD-10-CM

## 2020-04-15 DIAGNOSIS — T39395A Adverse effect of other nonsteroidal anti-inflammatory drugs [NSAID], initial encounter: Secondary | ICD-10-CM

## 2020-04-15 MED ORDER — PANTOPRAZOLE SODIUM 40 MG PO TBEC: 40.0000 mg | DELAYED_RELEASE_TABLET | Freq: Every day | ORAL | 3 refills | Status: DC

## 2020-04-15 NOTE — Progress Notes (Signed)
Date:  04/15/2020   Name:  Catherine Page   DOB:  [birth year]   MRN:  [id number]   Chief Complaint: Abdominal Pain (Started x 3 weeks ago- sharp pains in the middle of abdomen radiating to left. Gas X isn't helping. Waking up in the middle of night with the pain. ), Flu Vaccine, and tdap (Tdap needed)  Abdominal Pain This is a new problem. Episode onset: months/ worse since Thanksgiving. The onset quality is gradual. The problem occurs 2 to 4 times per day. The problem has been gradually worsening. The pain is located in the epigastric region. The pain is at a severity of 8/10. The pain is moderate. The quality of the pain is colicky and sharp. The abdominal pain radiates to the back. Pertinent negatives include no anorexia, arthralgias, belching, constipation, diarrhea, dysuria, fever, flatus, frequency, headaches, hematochezia, hematuria, melena, myalgias, nausea, vomiting or weight loss. Associated symptoms comments: History IBS. The pain is aggravated by eating and NSAIDs (salad dressing). The pain is relieved by eating. She has tried proton pump inhibitors for the symptoms. The treatment provided mild relief. Prior diagnostic workup includes upper endoscopy. Her past medical history is significant for abdominal surgery, GERD and irritable bowel syndrome. There is no history of pancreatitis or PUD. hiatal hernia surgery    No results found for: CREATININE, BUN, NA, K, CL, CO2 No results found for: CHOL, HDL, LDLCALC, LDLDIRECT, TRIG, CHOLHDL No results found for: TSH No results found for: HGBA1C No results found for: WBC, HGB, HCT, MCV, PLT No results found for: ALT, AST, GGT, ALKPHOS, BILITOT   Review of Systems  Constitutional: Negative.  Negative for chills, fatigue, fever, unexpected weight change and weight loss.  HENT: Negative for congestion, ear discharge, ear pain, rhinorrhea, sinus pressure, sneezing and sore throat.   Eyes: Negative for double vision, photophobia, pain,  discharge, redness and itching.  Respiratory: Negative for cough, shortness of breath, wheezing and stridor.   Gastrointestinal: Positive for abdominal pain. Negative for anorexia, blood in stool, constipation, diarrhea, flatus, hematochezia, melena, nausea and vomiting.  Endocrine: Negative for cold intolerance, heat intolerance, polydipsia, polyphagia and polyuria.  Genitourinary: Negative for dysuria, flank pain, frequency, hematuria, menstrual problem, pelvic pain, urgency, vaginal bleeding and vaginal discharge.  Musculoskeletal: Negative for arthralgias, back pain and myalgias.  Skin: Negative for rash.  Allergic/Immunologic: Negative for environmental allergies and food allergies.  Neurological: Negative for dizziness, weakness, light-headedness, numbness and headaches.  Hematological: Negative for adenopathy. Does not bruise/bleed easily.  Psychiatric/Behavioral: Negative for dysphoric mood. The patient is not nervous/anxious.     There are no problems to display for this patient.   No Known Allergies  Past Surgical History:  Procedure Laterality Date  . FOOT SURGERY    . SHOULDER SURGERY Left 05/03/2018    Social History   Tobacco Use  . Smoking status: Never Smoker  . Smokeless tobacco: Never Used  Vaping Use  . Vaping Use: Never used  Substance Use Topics  . Alcohol use: Not Currently    Comment: rarely  . Drug use: Never     Medication list has been reviewed and updated.  Current Meds  Medication Sig  . alprazolam (XANAX) 2 MG tablet Take 2 mg by mouth at bedtime as needed for sleep. CBC  . amphetamine-dextroamphetamine (ADDERALL) 20 MG tablet Take 20 mg by mouth 2 (two) times daily. CBC  . ARIPiprazole (ABILIFY) 2 MG tablet Take 2 mg by mouth daily. CBC  . desvenlafaxine (PRISTIQ) 100 MG  24 hr tablet Take 100 mg by mouth daily. CBC  . Esketamine HCl (SPRAVATO, 84 MG DOSE, NA) Place 1 Inhaler into the nose every 30 (thirty) days. CBC  . zolpidem (AMBIEN) 10  MG tablet Take 12.5 mg by mouth at bedtime as needed for sleep. CBC    PHQ 2/9 Scores 04/15/2020 08/16/2019  PHQ - 2 Score 0 0  PHQ- 9 Score 1 1    GAD 7 : Generalized Anxiety Score 04/15/2020 08/16/2019  Nervous, Anxious, on Edge 0 0  Control/stop worrying 0 0  Worry too much - different things 0 0  Trouble relaxing 0 0  Restless 0 0  Easily annoyed or irritable 0 0  Afraid - awful might happen 0 0  Total GAD 7 Score 0 0  Anxiety Difficulty - Not difficult at all    BP Readings from Last 3 Encounters:  04/15/20 120/80  08/16/19 110/80  12/28/17 128/69    Physical Exam Vitals and nursing note reviewed.  Constitutional:      General: She is not in acute distress.    Appearance: She is obese. She is not diaphoretic.  HENT:     Head: Normocephalic and atraumatic.     Right Ear: Tympanic membrane, ear canal and external ear normal.     Left Ear: Tympanic membrane, ear canal and external ear normal.     Nose: Nose normal.     Mouth/Throat:     Mouth: Oropharynx is clear and moist.  Eyes:     General:        Right eye: No discharge.        Left eye: No discharge.     Extraocular Movements: EOM normal.     Conjunctiva/sclera: Conjunctivae normal.     Pupils: Pupils are equal, round, and reactive to light.  Neck:     Thyroid: No thyromegaly.     Vascular: No JVD.  Cardiovascular:     Rate and Rhythm: Normal rate and regular rhythm.     Pulses: Intact distal pulses.     Heart sounds: Normal heart sounds. No murmur heard. No friction rub. No gallop.   Pulmonary:     Effort: Pulmonary effort is normal.     Breath sounds: Normal breath sounds.  Abdominal:     General: Bowel sounds are normal.     Palpations: Abdomen is soft. There is no hepatomegaly, splenomegaly or mass.     Tenderness: There is abdominal tenderness in the epigastric area. There is no guarding or rebound.     Hernia: No hernia is present. There is no hernia in the umbilical area or ventral area.   Musculoskeletal:        General: No edema. Normal range of motion.     Cervical back: Normal range of motion and neck supple.  Lymphadenopathy:     Cervical: No cervical adenopathy.  Skin:    General: Skin is warm and dry.  Neurological:     Mental Status: She is alert.     Deep Tendon Reflexes: Reflexes are normal and symmetric.     Wt Readings from Last 3 Encounters:  04/15/20 237 lb (107.5 kg)  08/16/19 240 lb (108.9 kg)  12/28/17 170 lb (77.1 kg)    BP 120/80   Pulse 84   Ht 5\' 4"  (1.626 m)   Wt 237 lb (107.5 kg)   LMP  (LMP Unknown)   BMI 40.68 kg/m   Assessment and Plan: Patient's chart was reviewed for previous  encounters most recent labs and most recent imaging.  Any procedures or encounters out[place]of[place]state have not been able to be located as of yet and we will continue to search for these. 1. Epigastric pain New onset.  Episodic.  Uncontrolled.  Now on a daily basis.  Abdominal pain has been going on significantly since Thanksgiving with a baseline abdominal pain for almost a year.  Pain is been in the epigastric area and radiates to the back it is sharp and cramping.  Patient has had endoscopy and evaluation for gallbladder concerns but this is been 6 to 8 years ago.  Because of most recent exacerbation we will further evaluate with the ultrasound and labs.  We will also submit a GI consult for concerns noted below - CBC with Differential/Platelet - Hepatic Function Panel (6) - Lipase - H. pylori antibody, IgG - US Abdomen Complete; Future - pantoprazole (PROTONIX) 40 MG tablet; Take 1 tablet (40 mg total) by mouth daily.  Dispense: 30 tablet; Refill: 3 - Ambulatory referral to Gastroenterology  2. NSAID induced gastritis Chronic.  Persistent.  Stable.  Pain is been going on for almost a year and this is a concern because patient takes a significant amount of NSAIDs for shoulder arthralgias and foot pain because Tylenol does not relieve pain as well.  Discussed the  concern for gastritis and patient will try to make some decisions involving more acetaminophen for certain pains.  In the meantime we will refer to GI for concerns of gastritis probable past peptic ulcer.  We will also obtain H. pylori and will initiate pantoprazole 40 mg once a day. - H. pylori antibody, IgG - pantoprazole (PROTONIX) 40 MG tablet; Take 1 tablet (40 mg total) by mouth daily.  Dispense: 30 tablet; Refill: 3 - Ambulatory referral to Gastroenterology  3. Pain in joint of left shoulder Patient has an history of arthralgia of the shoulder for which she has been treated for impingement and other concerns.  She has been taking NSAIDs on a regular basis and probably exceed timeframe that this should be taken for this concern.  Patient has been talked about concerns for peptic ulcer disease and gastritis associated with this.  We will obtain orthopedic consult in the future when we recheck to see if there is further evaluations that need to be done  4. Need for immunization against influenza Discussion and administered - Flu Vaccine QUAD 36+ mos IM  5. Need for Tdap vaccination Discussion and administered - Tdap vaccine greater than or equal to 7yo IM

## 2020-04-17 LAB — CBC WITH DIFFERENTIAL/PLATELET
Basophils Absolute: 0.1 10*3/uL (ref 0.0–0.2)
Basos: 1 %
EOS (ABSOLUTE): 0.2 10*3/uL (ref 0.0–0.4)
Eos: 2 %
Hematocrit: 35.2 % (ref 34.0–46.6)
Hemoglobin: 11.1 g/dL (ref 11.1–15.9)
Immature Grans (Abs): 0 10*3/uL (ref 0.0–0.1)
Immature Granulocytes: 0 %
Lymphocytes Absolute: 2 10*3/uL (ref 0.7–3.1)
Lymphs: 28 %
MCH: 25.2 pg — ABNORMAL LOW (ref 26.6–33.0)
MCHC: 31.5 g/dL (ref 31.5–35.7)
MCV: 80 fL (ref 79–97)
Monocytes Absolute: 0.4 10*3/uL (ref 0.1–0.9)
Monocytes: 6 %
Neutrophils Absolute: 4.5 10*3/uL (ref 1.4–7.0)
Neutrophils: 63 %
Platelets: 420 10*3/uL (ref 150–450)
RBC: 4.41 x10E6/uL (ref 3.77–5.28)
RDW: 16.5 % — ABNORMAL HIGH (ref 11.7–15.4)
WBC: 7.1 10*3/uL (ref 3.4–10.8)

## 2020-04-17 LAB — LIPASE: Lipase: 25 U/L (ref 14–72)

## 2020-04-17 LAB — HEPATIC FUNCTION PANEL (6)
ALT: 12 IU/L (ref 0–32)
AST: 16 IU/L (ref 0–40)
Albumin: 4.5 g/dL (ref 3.8–4.9)
Alkaline Phosphatase: 165 IU/L — ABNORMAL HIGH (ref 44–121)
Bilirubin Total: <0.2 mg/dL (ref 0.0–1.2)
Bilirubin, Direct: <0.1 mg/dL (ref 0.00–0.40)

## 2020-04-17 LAB — H. PYLORI ANTIBODY, IGG: H. pylori, IgG AbS: 0.2 Index Value (ref 0.00–0.79)

## 2020-04-21 ENCOUNTER — Other Ambulatory Visit: Payer: Self-pay

## 2020-04-21 ENCOUNTER — Ambulatory Visit
Admission: RE | Admit: 2020-04-21 | Discharge: 2020-04-21 | Disposition: A | Discharge status: Home / Self Care | Payer: Federal, State, Local not specified - PPO | Source: Ambulatory Visit | Attending: Family Medicine | Admitting: Family Medicine

## 2020-04-21 DIAGNOSIS — R1013 Epigastric pain: Secondary | ICD-10-CM | POA: Insufficient documentation

## 2020-04-21 DIAGNOSIS — K760 Fatty (change of) liver, not elsewhere classified: Secondary | ICD-10-CM | POA: Diagnosis not present

## 2020-04-29 ENCOUNTER — Ambulatory Visit (INDEPENDENT_AMBULATORY_CARE_PROVIDER_SITE_OTHER): Payer: Federal, State, Local not specified - PPO | Admitting: Family Medicine

## 2020-04-29 ENCOUNTER — Other Ambulatory Visit: Payer: Self-pay

## 2020-04-29 ENCOUNTER — Encounter: Payer: Self-pay | Admitting: Family Medicine

## 2020-04-29 VITALS — BP 122/86 | HR 80 | Ht 64.0 in | Wt 235.0 lb

## 2020-04-29 DIAGNOSIS — D509 Iron deficiency anemia, unspecified: Secondary | ICD-10-CM | POA: Diagnosis not present

## 2020-04-29 DIAGNOSIS — E559 Vitamin D deficiency, unspecified: Secondary | ICD-10-CM

## 2020-04-29 DIAGNOSIS — K760 Fatty (change of) liver, not elsewhere classified: Secondary | ICD-10-CM

## 2020-04-29 DIAGNOSIS — R1319 Other dysphagia: Secondary | ICD-10-CM | POA: Diagnosis not present

## 2020-04-29 DIAGNOSIS — Z6841 Body Mass Index (BMI) 40.0 and over, adult: Secondary | ICD-10-CM

## 2020-04-29 DIAGNOSIS — E7801 Familial hypercholesterolemia: Secondary | ICD-10-CM | POA: Insufficient documentation

## 2020-04-29 LAB — IRON,TIBC AND FERRITIN PANEL: Ferritin: 10

## 2020-04-29 NOTE — Patient Instructions (Signed)

## 2020-04-29 NOTE — Progress Notes (Signed)
Date:  04/29/2020   Name:  Catherine Page   DOB:  04-18-1961   MRN:  [id number]   Chief Complaint: Follow-up (US showed fatty liver/ talk about lipid draw and poss med after results)  Hyperlipidemia This is a new problem. The current episode started more than 1 year ago. The problem is controlled. Recent lipid tests were reviewed and are normal. She has no history of chronic renal disease, diabetes, hypothyroidism, liver disease, obesity or nephrotic syndrome. Pertinent negatives include no chest pain, focal sensory loss, focal weakness, leg pain, myalgias or shortness of breath. Current antihyperlipidemic treatment includes diet change. The current treatment provides mild improvement of lipids. There are no compliance problems.  Risk factors for coronary artery disease include dyslipidemia.  Anemia Presents for follow-up visit. There has been no abdominal pain, bruising/bleeding easily, fever, light-headedness or palpitations. Signs of blood loss that are not present include vaginal bleeding. There is no history of chronic renal disease or hypothyroidism. There are no compliance problems.   GI Problem Primary symptoms do not include fever, fatigue, abdominal pain, nausea, vomiting, diarrhea, dysuria, myalgias, arthralgias or rash.  The illness is also significant for dysphagia. The illness does not include chills, constipation or back pain. Significant associated medical issues include GERD. Associated medical issues do not include liver disease.    No results found for: CREATININE, BUN, NA, K, CL, CO2 No results found for: CHOL, HDL, LDLCALC, LDLDIRECT, TRIG, CHOLHDL No results found for: TSH No results found for: HGBA1C Lab Results  Component Value Date   WBC 7.1 04/15/2020   HGB 11.1 04/15/2020   HCT 35.2 04/15/2020   MCV 80 04/15/2020   PLT 420 04/15/2020   Lab Results  Component Value Date   ALT 12 04/15/2020   AST 16 04/15/2020   ALKPHOS 165 (H) 04/15/2020   BILITOT <0.2  04/15/2020     Review of Systems  Constitutional: Negative.  Negative for chills, fatigue, fever and unexpected weight change.  HENT: Negative for congestion, ear discharge, ear pain, rhinorrhea, sinus pressure, sneezing and sore throat.   Eyes: Negative for double vision, photophobia, pain, discharge, redness and itching.  Respiratory: Negative for cough, shortness of breath, wheezing and stridor.   Cardiovascular: Negative for chest pain and palpitations.  Gastrointestinal: Positive for dysphagia. Negative for abdominal pain, blood in stool, constipation, diarrhea, nausea and vomiting.  Endocrine: Negative for cold intolerance, heat intolerance, polydipsia, polyphagia and polyuria.  Genitourinary: Negative for dysuria, flank pain, frequency, hematuria, menstrual problem, pelvic pain, urgency, vaginal bleeding and vaginal discharge.  Musculoskeletal: Negative for arthralgias, back pain and myalgias.  Skin: Negative for rash.  Allergic/Immunologic: Negative for environmental allergies and food allergies.  Neurological: Negative for dizziness, focal weakness, weakness, light-headedness, numbness and headaches.  Hematological: Negative for adenopathy. Does not bruise/bleed easily.  Psychiatric/Behavioral: Negative for dysphoric mood. The patient is not nervous/anxious.     There are no problems to display for this patient.   No Known Allergies  Past Surgical History:  Procedure Laterality Date  . FOOT SURGERY    . SHOULDER SURGERY Left 05/03/2018    Social History   Tobacco Use  . Smoking status: Never Smoker  . Smokeless tobacco: Never Used  Vaping Use  . Vaping Use: Never used  Substance Use Topics  . Alcohol use: Not Currently    Comment: rarely  . Drug use: Never     Medication list has been reviewed and updated.  Current Meds  Medication Sig  . alprazolam (  XANAX) 2 MG tablet Take 2 mg by mouth at bedtime as needed for sleep. CBC  . amphetamine-dextroamphetamine  (ADDERALL) 20 MG tablet Take 20 mg by mouth 2 (two) times daily. CBC  . ARIPiprazole (ABILIFY) 2 MG tablet Take 2 mg by mouth daily. CBC  . desvenlafaxine (PRISTIQ) 100 MG 24 hr tablet Take 100 mg by mouth daily. CBC  . Esketamine HCl (SPRAVATO, 84 MG DOSE, NA) Place 1 Inhaler into the nose every 30 (thirty) days. CBC  . pantoprazole (PROTONIX) 40 MG tablet Take 1 tablet (40 mg total) by mouth daily.  Marland Kitchen zolpidem (AMBIEN) 10 MG tablet Take 12.5 mg by mouth at bedtime as needed for sleep. CBC    PHQ 2/9 Scores 04/15/2020 08/16/2019  PHQ - 2 Score 0 0  PHQ- 9 Score 1 1    GAD 7 : Generalized Anxiety Score 04/15/2020 08/16/2019  Nervous, Anxious, on Edge 0 0  Control/stop worrying 0 0  Worry too much - different things 0 0  Trouble relaxing 0 0  Restless 0 0  Easily annoyed or irritable 0 0  Afraid - awful might happen 0 0  Total GAD 7 Score 0 0  Anxiety Difficulty - Not difficult at all    BP Readings from Last 3 Encounters:  04/29/20 122/86  04/15/20 120/80  08/16/19 110/80    Physical Exam Vitals and nursing note reviewed.  Constitutional:      Appearance: She is well-developed and well-nourished.  HENT:     Head: Normocephalic.     Right Ear: Tympanic membrane and external ear normal.     Left Ear: Tympanic membrane and external ear normal.     Nose: Nose normal. No congestion.     Mouth/Throat:     Mouth: Oropharynx is clear and moist. Mucous membranes are moist.  Eyes:     General: Lids are everted, no foreign bodies appreciated. No scleral icterus.       Left eye: No foreign body or hordeolum.     Extraocular Movements: EOM normal.     Conjunctiva/sclera: Conjunctivae normal.     Right eye: Right conjunctiva is not injected.     Left eye: Left conjunctiva is not injected.     Pupils: Pupils are equal, round, and reactive to light.  Neck:     Thyroid: No thyromegaly.     Vascular: No JVD.     Trachea: No tracheal deviation.  Cardiovascular:     Rate and Rhythm:  Normal rate and regular rhythm.     Pulses: Intact distal pulses.     Heart sounds: Normal heart sounds. No murmur heard. No friction rub. No gallop.   Pulmonary:     Effort: Pulmonary effort is normal. No respiratory distress.     Breath sounds: Normal breath sounds. No wheezing or rales.  Abdominal:     General: Bowel sounds are normal.     Palpations: Abdomen is soft. There is no hepatomegaly, splenomegaly, hepatosplenomegaly or mass.     Tenderness: There is no abdominal tenderness. There is no guarding or rebound.  Musculoskeletal:        General: No tenderness or edema. Normal range of motion.     Cervical back: Normal range of motion and neck supple.  Lymphadenopathy:     Cervical: No cervical adenopathy.  Skin:    General: Skin is warm.     Findings: No rash.  Neurological:     Mental Status: She is alert and oriented to person, place,  and time.     Cranial Nerves: No cranial nerve deficit.     Deep Tendon Reflexes: Strength normal. Reflexes normal.  Psychiatric:        Mood and Affect: Mood and affect normal. Mood is not anxious or depressed.     Wt Readings from Last 3 Encounters:  04/29/20 235 lb (106.6 kg)  04/15/20 237 lb (107.5 kg)  08/16/19 240 lb (108.9 kg)    BP 122/86   Pulse 80   Ht 5\' 4"  (1.626 m)   Wt 235 lb (106.6 kg)   LMP  (LMP Unknown)   BMI 40.34 kg/m   Assessment and Plan: 1. Hepatic steatosis Chronic.  Persistent.  Stable.  Ultrasound noted that there is echogenicity consistent with steatosis.  We will check patient's lipid concerns and have discussed the need to control this without affecting the liver which may result in Zetia 10 mg.  2. Familial hypercholesterolemia Patient's had an elevation of cholesterol in the past and is overweight.  We will check a lipid panel to determine current status of LDL triglyceride levels. - Lipid Panel With LDL/HDL Ratio  3. Vitamin D deficiency Chronic.  Controlled.  Stable.  Patient has noted to have  low vitamin D in the past but has not been taking supplemental vitamin D at present.  We will recheck vitamin D and reassess need for supplementation. - Vitamin D 1,25 dihydroxy  4. Esophageal dysphagia Chronic.  Episodic.  Patient is having esophageal dysphagia pending what she eats and has an upcoming appointment with gastroenterology and will discuss this with them at this time.  5. Iron deficiency anemia, unspecified iron deficiency anemia type Chronic.  Uncontrolled.  Stable.  CBC is at the absolute lower level of normal of both hemoglobin and hematocrit as well as MCV.  Will check serum ferritin to indicate if there is low iron stores. - Ferritin, Serum (Serial)  6. BMI 40.0-44.9, adult [facility]) Discussion with patient and weight loss is necessary.  Patient has been given low-cholesterol sheet for weight loss and control of cholesterol lipids.

## 2020-05-03 LAB — LIPID PANEL WITH LDL/HDL RATIO
Cholesterol, Total: 259 mg/dL — ABNORMAL HIGH (ref 100–199)
HDL: 56 mg/dL (ref >39)
LDL Chol Calc (NIH): 183 mg/dL — ABNORMAL HIGH (ref 0–99)
LDL/HDL Ratio: 3.3 ratio — ABNORMAL HIGH (ref 0.0–3.2)
Triglycerides: 114 mg/dL (ref 0–149)
VLDL Cholesterol Cal: 20 mg/dL (ref 5–40)

## 2020-05-03 LAB — VITAMIN D 1,25 DIHYDROXY
Vitamin D 1, 25 (OH)2 Total: 93 pg/mL — ABNORMAL HIGH
Vitamin D2 1, 25 (OH)2: <10 pg/mL
Vitamin D3 1, 25 (OH)2: 92 pg/mL

## 2020-05-03 LAB — FERRITIN, SERUM (SERIAL): Ferritin: 10 ng/mL — ABNORMAL LOW (ref 15–150)

## 2020-05-05 ENCOUNTER — Other Ambulatory Visit: Payer: Self-pay

## 2020-05-05 DIAGNOSIS — D509 Iron deficiency anemia, unspecified: Secondary | ICD-10-CM

## 2020-05-05 MED ORDER — FERROUS SULFATE 324 (65 FE) MG PO TBEC: 1.0000 | DELAYED_RELEASE_TABLET | Freq: Every day | ORAL | 0 refills | Status: DC

## 2020-05-05 NOTE — Progress Notes (Unsigned)
Advised pt to pick up ferrous sulfate daily

## 2020-05-15 DIAGNOSIS — F330 Major depressive disorder, recurrent, mild: Secondary | ICD-10-CM | POA: Diagnosis not present

## 2020-05-22 ENCOUNTER — Ambulatory Visit: Payer: Federal, State, Local not specified - PPO | Admitting: Gastroenterology

## 2020-05-22 ENCOUNTER — Other Ambulatory Visit: Payer: Self-pay

## 2020-05-22 ENCOUNTER — Encounter: Payer: Self-pay | Admitting: Gastroenterology

## 2020-08-14 DIAGNOSIS — F330 Major depressive disorder, recurrent, mild: Secondary | ICD-10-CM | POA: Diagnosis not present

## 2020-08-22 DIAGNOSIS — F331 Major depressive disorder, recurrent, moderate: Secondary | ICD-10-CM | POA: Diagnosis not present

## 2020-08-22 DIAGNOSIS — F330 Major depressive disorder, recurrent, mild: Secondary | ICD-10-CM | POA: Diagnosis not present

## 2020-08-22 DIAGNOSIS — F332 Major depressive disorder, recurrent severe without psychotic features: Secondary | ICD-10-CM | POA: Diagnosis not present

## 2020-08-22 DIAGNOSIS — F902 Attention-deficit hyperactivity disorder, combined type: Secondary | ICD-10-CM | POA: Diagnosis not present

## 2020-11-05 DIAGNOSIS — G4700 Insomnia, unspecified: Secondary | ICD-10-CM | POA: Diagnosis not present

## 2020-11-05 DIAGNOSIS — F902 Attention-deficit hyperactivity disorder, combined type: Secondary | ICD-10-CM | POA: Diagnosis not present

## 2020-11-05 DIAGNOSIS — F332 Major depressive disorder, recurrent severe without psychotic features: Secondary | ICD-10-CM | POA: Diagnosis not present

## 2020-11-13 DIAGNOSIS — F330 Major depressive disorder, recurrent, mild: Secondary | ICD-10-CM | POA: Diagnosis not present

## 2020-11-13 DIAGNOSIS — F331 Major depressive disorder, recurrent, moderate: Secondary | ICD-10-CM | POA: Diagnosis not present

## 2020-11-13 DIAGNOSIS — F902 Attention-deficit hyperactivity disorder, combined type: Secondary | ICD-10-CM | POA: Diagnosis not present

## 2020-11-13 DIAGNOSIS — F332 Major depressive disorder, recurrent severe without psychotic features: Secondary | ICD-10-CM | POA: Diagnosis not present

## 2020-11-20 DIAGNOSIS — F330 Major depressive disorder, recurrent, mild: Secondary | ICD-10-CM | POA: Diagnosis not present

## 2020-11-20 DIAGNOSIS — F332 Major depressive disorder, recurrent severe without psychotic features: Secondary | ICD-10-CM | POA: Diagnosis not present

## 2020-11-20 DIAGNOSIS — F902 Attention-deficit hyperactivity disorder, combined type: Secondary | ICD-10-CM | POA: Diagnosis not present

## 2020-11-20 DIAGNOSIS — F331 Major depressive disorder, recurrent, moderate: Secondary | ICD-10-CM | POA: Diagnosis not present

## 2020-11-26 DIAGNOSIS — F330 Major depressive disorder, recurrent, mild: Secondary | ICD-10-CM | POA: Diagnosis not present

## 2020-12-04 DIAGNOSIS — F332 Major depressive disorder, recurrent severe without psychotic features: Secondary | ICD-10-CM | POA: Diagnosis not present

## 2020-12-04 DIAGNOSIS — F331 Major depressive disorder, recurrent, moderate: Secondary | ICD-10-CM | POA: Diagnosis not present

## 2020-12-04 DIAGNOSIS — F330 Major depressive disorder, recurrent, mild: Secondary | ICD-10-CM | POA: Diagnosis not present

## 2020-12-04 DIAGNOSIS — F902 Attention-deficit hyperactivity disorder, combined type: Secondary | ICD-10-CM | POA: Diagnosis not present

## 2020-12-12 ENCOUNTER — Other Ambulatory Visit: Payer: Self-pay | Admitting: Family Medicine

## 2020-12-12 DIAGNOSIS — R1013 Epigastric pain: Secondary | ICD-10-CM

## 2020-12-12 DIAGNOSIS — T39395A Adverse effect of other nonsteroidal anti-inflammatory drugs [NSAID], initial encounter: Secondary | ICD-10-CM

## 2020-12-12 DIAGNOSIS — K2960 Other gastritis without bleeding: Secondary | ICD-10-CM

## 2020-12-25 DIAGNOSIS — F330 Major depressive disorder, recurrent, mild: Secondary | ICD-10-CM | POA: Diagnosis not present

## 2021-01-29 DIAGNOSIS — F909 Attention-deficit hyperactivity disorder, unspecified type: Secondary | ICD-10-CM | POA: Diagnosis not present

## 2021-01-29 DIAGNOSIS — F330 Major depressive disorder, recurrent, mild: Secondary | ICD-10-CM | POA: Diagnosis not present

## 2021-01-29 DIAGNOSIS — F902 Attention-deficit hyperactivity disorder, combined type: Secondary | ICD-10-CM | POA: Diagnosis not present

## 2021-01-29 DIAGNOSIS — G4700 Insomnia, unspecified: Secondary | ICD-10-CM | POA: Diagnosis not present

## 2021-03-19 DIAGNOSIS — Z79899 Other long term (current) drug therapy: Secondary | ICD-10-CM | POA: Diagnosis not present

## 2021-03-19 DIAGNOSIS — Z1389 Encounter for screening for other disorder: Secondary | ICD-10-CM | POA: Diagnosis not present

## 2021-03-19 DIAGNOSIS — F331 Major depressive disorder, recurrent, moderate: Secondary | ICD-10-CM | POA: Diagnosis not present

## 2021-06-01 IMAGING — MG DIGITAL SCREENING BILAT W/ TOMO W/ CAD
8 series · 8 of 24 positions shown · non-contrast
Comparison: Previous exam(s).

CLINICAL DATA: Screening.

EXAM:
DIGITAL SCREENING BILATERAL MAMMOGRAM WITH TOMO AND CAD

[L MLO synth-2D]
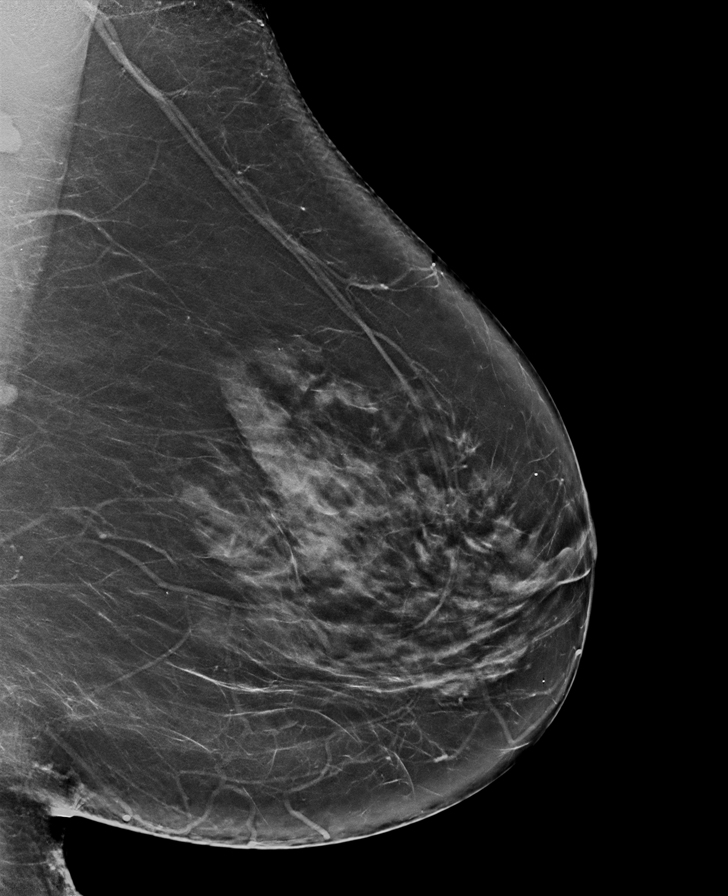

[R CC synth-2D]
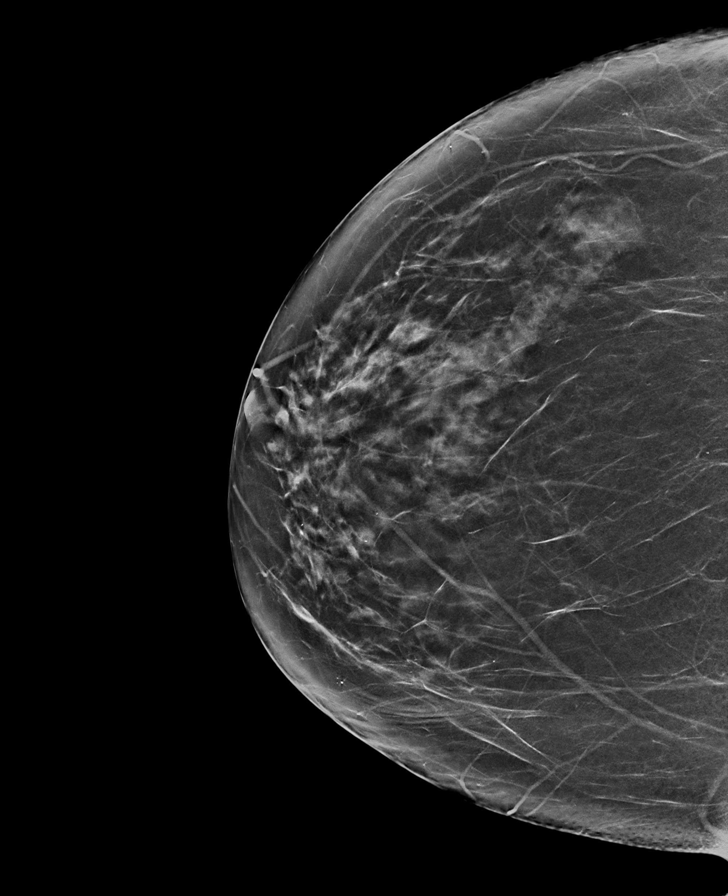

[R MLO synth-2D]
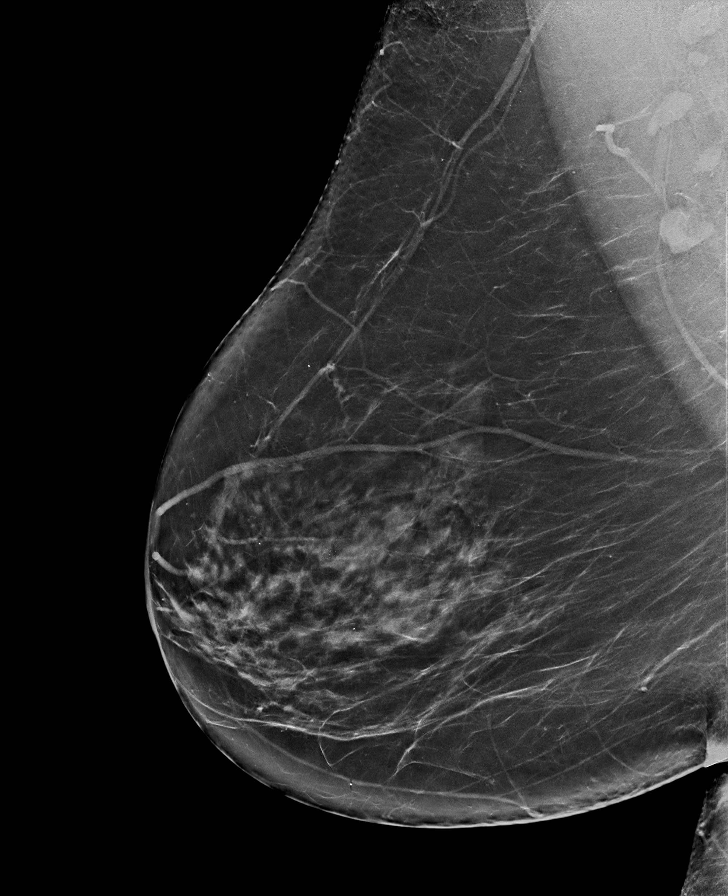

[L CC synth-2D]
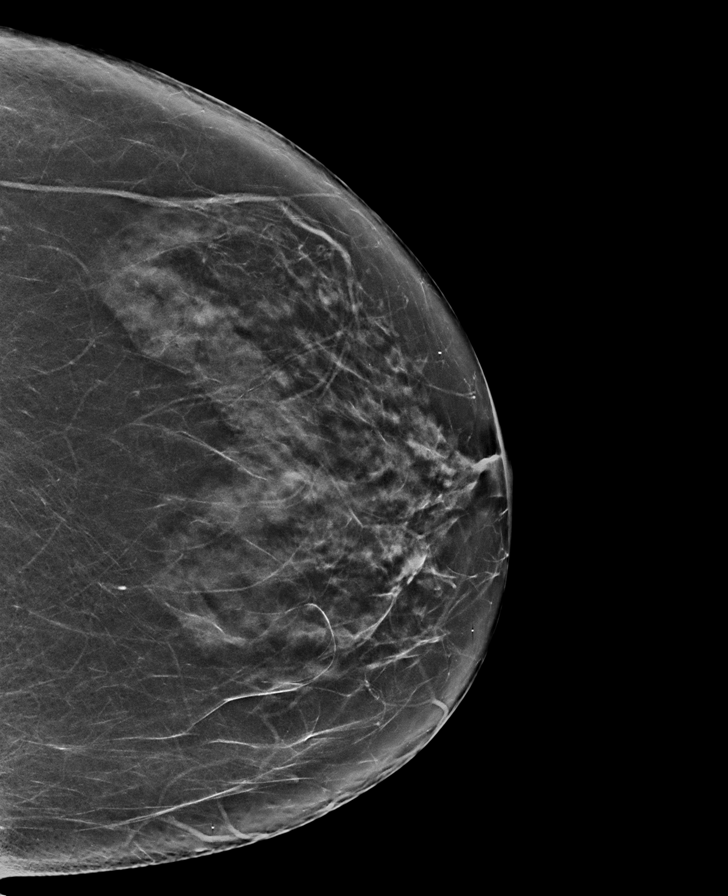

[R MLO tomo · tomo slice 43/84.0]
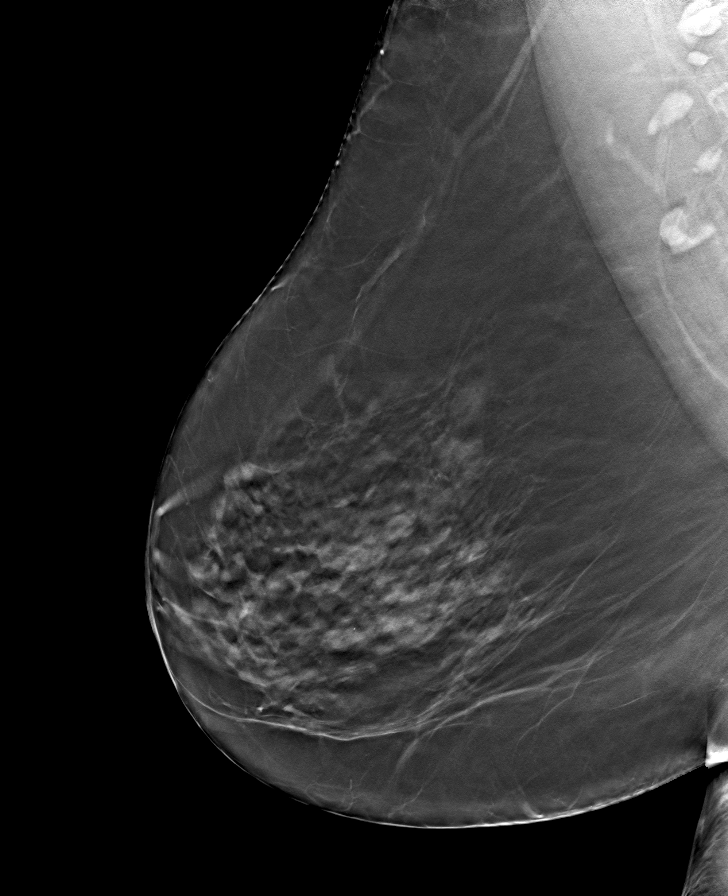

[R CC tomo · tomo slice 37/73.0]
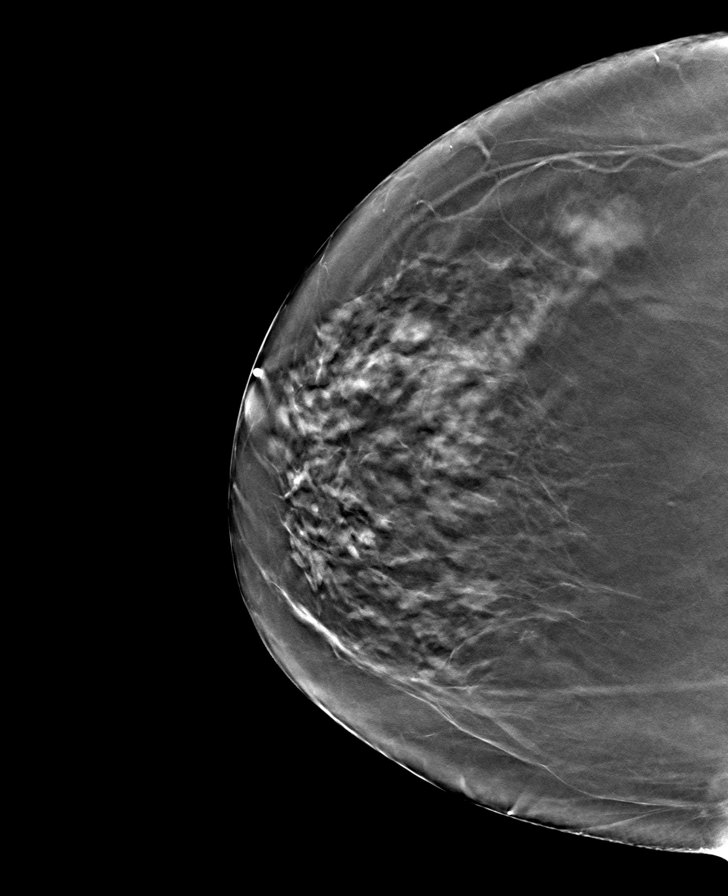

[L CC tomo · tomo slice 39/76.0]
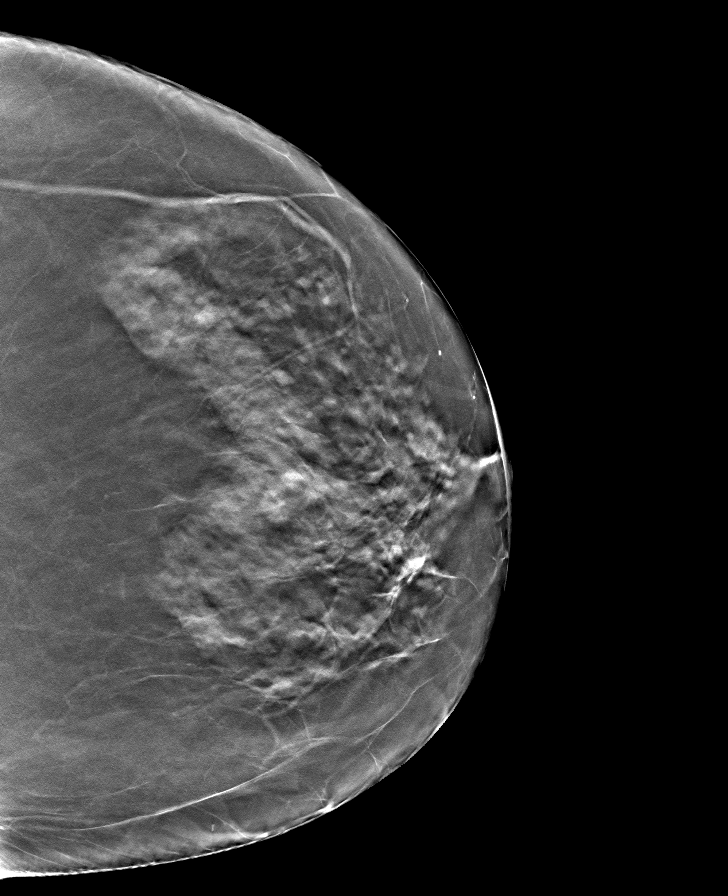

[L MLO tomo · tomo slice 42/83.0]
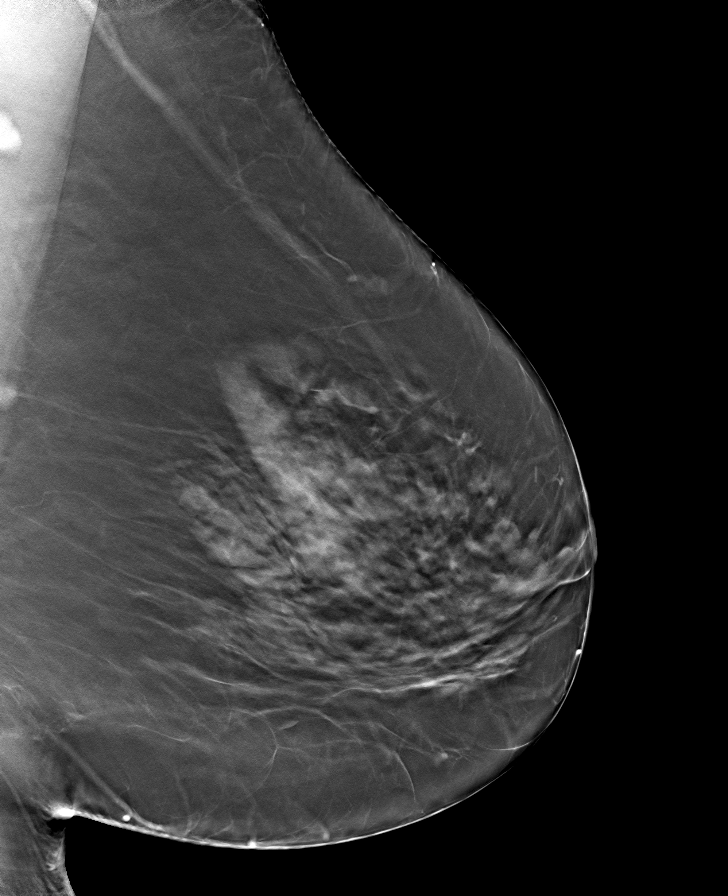

[8 of 24 positions shown; findings below may reference images not displayed]

ACR Breast Density Category c: The breast tissue is heterogeneously
dense, which may obscure small masses.
FINDINGS: There are no findings suspicious for malignancy. Images were
processed with CAD.
IMPRESSION: No mammographic evidence of malignancy. A result letter of this
screening mammogram will be mailed directly to the patient.

RECOMMENDATION:
Screening mammogram in one year. (Code:FT-U-LHB)

BI-RADS CATEGORY  1: Negative.

## 2021-09-21 DIAGNOSIS — F331 Major depressive disorder, recurrent, moderate: Secondary | ICD-10-CM | POA: Diagnosis not present

## 2021-09-21 DIAGNOSIS — F902 Attention-deficit hyperactivity disorder, combined type: Secondary | ICD-10-CM | POA: Diagnosis not present

## 2021-11-11 ENCOUNTER — Ambulatory Visit: Payer: Self-pay

## 2021-11-11 NOTE — Telephone Encounter (Signed)
  Chief Complaint: palpitations Symptoms: chest palpitations, sweating, HR tachy, dizziness, fatigued  Frequency: ongoing since Nov or Dec, happens several times a week  Pertinent Negatives: NA Disposition: [] ED /[] Urgent Care (no appt availability in office) / [x] Appointment(In office/virtual)/ []  [place] [facility]/ [] Home Care/ [] Refused Recommended Disposition /[] [place] [facility]/ []  Follow-up with PCP Additional Notes: pt states this is ongoing issue. Gets worse with exercise when symptoms are present. Pt hasn't had EKG done and unsure what causing symptoms. Symptoms are not present now and last time present was yesterday. Scheduled for appt 11/12/21 at 1120.   Reason for Disposition  [1] Heart beating very rapidly (e.g., > 140 / minute) AND [2] not present now  (Exception: during exercise)  Answer Assessment - Initial Assessment Questions 1. DESCRIPTION: "Please describe your heart rate or heartbeat that you are having" (e.g., fast/slow, regular/irregular, skipped or extra beats, "palpitations")     palpitations 2. ONSET: "When did it start?" (Minutes, hours or days)      Since Nov or Dec 4. PATTERN "Does it come and go, or has it been constant since it started?"  "Does it get worse with exertion?"   "Are you feeling it now?"     Couple x/ week  10. OTHER SYMPTOMS: "Do you have any other symptoms?" (e.g., dizziness, chest pain, sweating, difficulty breathing)       Fatigued, sweating, SOB, slight dizziness  Protocols used: Heart Rate and Heartbeat Questions-A-AH

## 2021-11-12 ENCOUNTER — Ambulatory Visit: Payer: Federal, State, Local not specified - PPO | Admitting: Family Medicine

## 2021-11-12 ENCOUNTER — Encounter: Payer: Self-pay | Admitting: Family Medicine

## 2021-11-12 VITALS — BP 150/78 | HR 96 | Ht 64.0 in | Wt 258.0 lb

## 2021-11-12 DIAGNOSIS — Z0000 Encounter for general adult medical examination without abnormal findings: Secondary | ICD-10-CM

## 2021-11-12 DIAGNOSIS — R002 Palpitations: Secondary | ICD-10-CM

## 2021-11-12 DIAGNOSIS — R079 Chest pain, unspecified: Secondary | ICD-10-CM | POA: Diagnosis not present

## 2021-11-12 DIAGNOSIS — D649 Anemia, unspecified: Secondary | ICD-10-CM | POA: Diagnosis not present

## 2021-11-12 DIAGNOSIS — R1319 Other dysphagia: Secondary | ICD-10-CM | POA: Diagnosis not present

## 2021-11-12 DIAGNOSIS — D509 Iron deficiency anemia, unspecified: Secondary | ICD-10-CM

## 2021-11-12 NOTE — Progress Notes (Signed)
Date:  11/12/2021   Name:  Catherine Page   DOB:  May 27, 1960   MRN:  [id number]   Chief Complaint: Palpitations (Feels heart racing and breaks out in a sweat- 4-5 times a week)  Palpitations  This is a chronic problem. The current episode started more than 1 month ago (9 months). The problem has been gradually worsening. On average, each episode lasts 2 minutes. Associated symptoms include chest fullness, chest pain, dizziness and nausea. Pertinent negatives include no fever, numbness or shortness of breath. The treatment provided mild relief.  Chest Pain  This is a new problem. The current episode started more than 1 month ago. The problem occurs intermittently. The pain is present in the substernal region. The quality of the pain is described as heavy and tightness. Associated symptoms include abdominal pain, dizziness, exertional chest pressure, nausea and palpitations. Pertinent negatives include no fever, numbness or shortness of breath. She has tried antacids for the symptoms. The treatment provided moderate relief.  GI Problem The primary symptoms include fatigue, abdominal pain and nausea. Primary symptoms do not include fever, diarrhea, melena, hematemesis or hematochezia.  The illness is also significant for dysphagia. Significant associated medical issues include irritable bowel syndrome.    No results found for: "NA", "K", "CO2", "GLUCOSE", "BUN", "CREATININE", "CALCIUM", "EGFR", "GFRNONAA" Lab Results  Component Value Date   CHOL 259 (H) 04/29/2020   HDL 56 04/29/2020   LDLCALC 183 (H) 04/29/2020   TRIG 114 04/29/2020   No results found for: "TSH" No results found for: "HGBA1C" Lab Results  Component Value Date   WBC 7.1 04/15/2020   HGB 11.1 04/15/2020   HCT 35.2 04/15/2020   MCV 80 04/15/2020   PLT 420 04/15/2020   Lab Results  Component Value Date   ALT 12 04/15/2020   AST 16 04/15/2020   ALKPHOS 165 (H) 04/15/2020   BILITOT <0.2 04/15/2020   No results found  for: "25OHVITD2", "25OHVITD3", "VD25OH"   Review of Systems  Constitutional:  Positive for fatigue and unexpected weight change. Negative for fever.  Respiratory:  Positive for chest tightness. Negative for choking, shortness of breath and wheezing.   Cardiovascular:  Positive for chest pain and palpitations. Negative for leg swelling.  Gastrointestinal:  Positive for abdominal pain, dysphagia and nausea. Negative for blood in stool, diarrhea, hematemesis, hematochezia and melena.  Genitourinary:  Negative for menstrual problem and vaginal bleeding.  Neurological:  Positive for dizziness. Negative for numbness.    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  Current Meds  Medication Sig   alprazolam (XANAX) 2 MG tablet Take 2 mg by mouth at bedtime as needed for sleep. CBC   amphetamine-dextroamphetamine (ADDERALL) 20 MG tablet Take 20 mg by mouth 2 (two) times daily. CBC   ARIPiprazole (ABILIFY) 2 MG tablet Take 2 mg by mouth daily. CBC   desvenlafaxine (PRISTIQ) 100 MG 24 hr tablet Take 100 mg by mouth daily.  CBC   zolpidem (AMBIEN) 10 MG tablet Take 12.5 mg by mouth at bedtime as needed for sleep. CBC   [DISCONTINUED] Esketamine HCl (SPRAVATO, 84 MG DOSE, NA) Place 1 Inhaler into the nose every 30 (thirty) days. CBC   [DISCONTINUED] Multiple Vitamin (MULTIVITAMIN ADULT PO) Take 1 tablet by mouth daily.       11/12/2021    3:17 PM 04/15/2020    8:07 AM 08/16/2019    2:11 PM  GAD 7 : Generalized Anxiety Score  Nervous, Anxious, on Edge 2 0 0  Control/stop worrying 0 0 0  Worry too much - different things 1 0 0  Trouble relaxing 0 0 0  Restless 0 0 0  Easily annoyed or irritable 1 0 0  Afraid - awful might happen 0 0 0  Total GAD 7 Score 4 0 0  Anxiety Difficulty Not difficult at all  Not difficult at all       11/12/2021    3:17 PM 04/15/2020    8:06 AM 08/16/2019    2:10 PM  Depression screen PHQ 2/9  Decreased Interest 1 0 0  Down, Depressed, Hopeless 1 0 0  PHQ - 2 Score 2 0 0  Altered sleeping 0 1 0  Tired, decreased energy 1 0 0  Change in appetite 0 0 0  Feeling bad or failure about yourself  0 0 0  Trouble concentrating 1 0 1  Moving slowly or fidgety/restless 0 0 0  Suicidal thoughts 0 0 0  PHQ-9 Score $RemoveBef'4 1 1  'sDzXuBFHMD$ Difficult doing work/chores Somewhat difficult Not difficult at all Not difficult at all    BP Readings from Last 3 Encounters:  11/12/21 (!) 150/78  04/29/20 122/86  04/15/20 120/80    Physical Exam  Wt Readings from Last 3 Encounters:  11/12/21 258 lb (117 kg)  04/29/20 235 lb (106.6 kg)  04/15/20 237 lb (107.5 kg)    BP (!) 150/78   Pulse 96   Ht $R'5\' 4"'Hh$  (1.626 m)   Wt 258 lb (117 kg)   LMP  (LMP Unknown)   BMI 44.29 kg/m   Assessment and Plan:  I spent 50 minutes with this patient, More than 50% of that time was spent in face to face education, counseling and care coordination.  Patient had multiple medical concerns which were ongoing and we addressed a few as a same-day appointment but will need to have patient referred since she has not been  here in almost 3 years. 1. Chest pain, unspecified type Chronic.  Controlled.  Stable.  Patient's been having some chest discomfort described as tightness.  Patient has multiple risk factors and EKG was done with the following results: Sinus rhythm with a rate of 89 intervals normal.  No LVH met by voltage criteria.  No ischemic changes such as Q waves, ST-T wave changes, nor delay in R wave progression.  In essence this is a normal EKG. - EKG 12-Lead - Renal Function Panel - Ambulatory referral to Cardiology  2. Palpitations New onset.  Persistent in terms of occurrence.  This  will last 1 to 2 minutes.  This will be up to 140 bpm.  Patient does have some dizziness at times.  There is no abnormality right now and EKG notes a rhythm of 89 in sinus rhythm.  We will check a TSH CBC to see if this is connected to given that she has a history of anemia.  We will also check renal function panel for electrolyte concerns.  Referral to cardiology for evaluation of these palpitations as well as chest discomfort will be forthcoming. - EKG 12-Lead - CBC with Differential/Platelet - TSH - Renal Function Panel - Ambulatory referral to Cardiology  3. Iron deficiency anemia, unspecified iron deficiency anemia type Chronic.  Patient has had a long history of anemia has been ignoring and not coming to the doctor for this we will check CBC to see current level of involvement.  And then address accordingly. - CBC with Differential/Platelet - TSH - Renal Function Panel  4. Esophageal dysphagia Patient has a history of esophageal dysphagia which I am wondering if Dr. Nicolasa Ducking did her endoscopy several years ago.  Per patient's history the she had abnormal cells and that she needed to be reevaluated.  We will refer to gastroenterology for not only evaluation of dysphagia but also patient is probably time for her colonoscopy as the last colonoscopy that I can locate was done in 2013. - TSH - Ambulatory referral to  Gastroenterology  5. Annual physical exam Patient needs to have further evaluation with gynecology for GYN maintenance and referral has been placed. - Ambulatory referral to Gynecology

## 2021-11-13 ENCOUNTER — Other Ambulatory Visit: Payer: Self-pay

## 2021-11-13 DIAGNOSIS — D649 Anemia, unspecified: Secondary | ICD-10-CM

## 2021-11-13 DIAGNOSIS — D509 Iron deficiency anemia, unspecified: Secondary | ICD-10-CM

## 2021-11-13 LAB — CBC WITH DIFFERENTIAL/PLATELET
Basophils Absolute: 0.1 10*3/uL (ref 0.0–0.2)
Basos: 1 %
EOS (ABSOLUTE): 0.4 10*3/uL (ref 0.0–0.4)
Eos: 4 %
Hematocrit: 27.6 % — ABNORMAL LOW (ref 34.0–46.6)
Hemoglobin: 7.8 g/dL — ABNORMAL LOW (ref 11.1–15.9)
Immature Grans (Abs): 0 10*3/uL (ref 0.0–0.1)
Immature Granulocytes: 0 %
Lymphocytes Absolute: 2.2 10*3/uL (ref 0.7–3.1)
Lymphs: 25 %
MCH: 20.6 pg — ABNORMAL LOW (ref 26.6–33.0)
MCHC: 28.3 g/dL — ABNORMAL LOW (ref 31.5–35.7)
MCV: 73 fL — ABNORMAL LOW (ref 79–97)
Monocytes Absolute: 0.6 10*3/uL (ref 0.1–0.9)
Monocytes: 7 %
Neutrophils Absolute: 5.3 10*3/uL (ref 1.4–7.0)
Neutrophils: 63 %
Platelets: 483 10*3/uL — ABNORMAL HIGH (ref 150–450)
RBC: 3.79 x10E6/uL (ref 3.77–5.28)
RDW: 17.8 % — ABNORMAL HIGH (ref 11.7–15.4)
WBC: 8.5 10*3/uL (ref 3.4–10.8)

## 2021-11-13 LAB — RENAL FUNCTION PANEL
Albumin: 4.4 g/dL (ref 3.8–4.9)
BUN/Creatinine Ratio: 18 (ref 9–23)
BUN: 12 mg/dL (ref 6–24)
CO2: 23 mmol/L (ref 20–29)
Calcium: 9.1 mg/dL (ref 8.7–10.2)
Chloride: 101 mmol/L (ref 96–106)
Creatinine, Ser: 0.66 mg/dL (ref 0.57–1.00)
Glucose: 111 mg/dL — ABNORMAL HIGH (ref 70–99)
Phosphorus: 2.7 mg/dL — ABNORMAL LOW (ref 3.0–4.3)
Potassium: 4.4 mmol/L (ref 3.5–5.2)
Sodium: 139 mmol/L (ref 134–144)
eGFR: 101 mL/min/{1.73_m2} (ref >59)

## 2021-11-13 LAB — TSH: TSH: 1.17 u[IU]/mL (ref 0.450–4.500)

## 2021-11-16 LAB — SPECIMEN STATUS REPORT

## 2021-11-16 LAB — FERRITIN: Ferritin: 6 ng/mL — ABNORMAL LOW (ref 15–150)

## 2021-11-19 ENCOUNTER — Other Ambulatory Visit: Payer: Self-pay

## 2021-11-19 ENCOUNTER — Encounter: Payer: Self-pay | Admitting: Emergency Medicine

## 2021-11-19 ENCOUNTER — Emergency Department
Admission: EM | Admit: 2021-11-19 | Discharge: 2021-11-19 | Disposition: A | Discharge status: Home / Self Care | Payer: Federal, State, Local not specified - PPO | Attending: Student in an Organized Health Care Education/Training Program | Admitting: Student in an Organized Health Care Education/Training Program

## 2021-11-19 ENCOUNTER — Emergency Department: Payer: Federal, State, Local not specified - PPO

## 2021-11-19 DIAGNOSIS — R1013 Epigastric pain: Secondary | ICD-10-CM

## 2021-11-19 DIAGNOSIS — D509 Iron deficiency anemia, unspecified: Secondary | ICD-10-CM | POA: Diagnosis not present

## 2021-11-19 DIAGNOSIS — R079 Chest pain, unspecified: Secondary | ICD-10-CM | POA: Diagnosis not present

## 2021-11-19 DIAGNOSIS — D649 Anemia, unspecified: Secondary | ICD-10-CM | POA: Insufficient documentation

## 2021-11-19 DIAGNOSIS — R0789 Other chest pain: Secondary | ICD-10-CM | POA: Diagnosis not present

## 2021-11-19 DIAGNOSIS — K219 Gastro-esophageal reflux disease without esophagitis: Secondary | ICD-10-CM | POA: Diagnosis not present

## 2021-11-19 DIAGNOSIS — R5383 Other fatigue: Secondary | ICD-10-CM | POA: Diagnosis not present

## 2021-11-19 DIAGNOSIS — Z86010 Personal history of colonic polyps: Secondary | ICD-10-CM | POA: Diagnosis not present

## 2021-11-19 DIAGNOSIS — K589 Irritable bowel syndrome without diarrhea: Secondary | ICD-10-CM | POA: Diagnosis not present

## 2021-11-19 DIAGNOSIS — K2960 Other gastritis without bleeding: Secondary | ICD-10-CM

## 2021-11-19 LAB — HEPATIC FUNCTION PANEL
ALT: 12 U/L (ref 0–44)
AST: 16 U/L (ref 15–41)
Albumin: 3.5 g/dL (ref 3.5–5.0)
Alkaline Phosphatase: 107 U/L (ref 38–126)
Bilirubin, Direct: <0.1 mg/dL (ref 0.0–0.2)
Total Bilirubin: 0.4 mg/dL (ref 0.3–1.2)
Total Protein: 6.8 g/dL (ref 6.5–8.1)

## 2021-11-19 LAB — CBC
HCT: 30.1 % — ABNORMAL LOW (ref 36.0–46.0)
Hemoglobin: 8.4 g/dL — ABNORMAL LOW (ref 12.0–15.0)
MCH: 20.7 pg — ABNORMAL LOW (ref 26.0–34.0)
MCHC: 27.9 g/dL — ABNORMAL LOW (ref 30.0–36.0)
MCV: 74.1 fL — ABNORMAL LOW (ref 80.0–100.0)
Platelets: 466 10*3/uL — ABNORMAL HIGH (ref 150–400)
RBC: 4.06 MIL/uL (ref 3.87–5.11)
RDW: 19.1 % — ABNORMAL HIGH (ref 11.5–15.5)
WBC: 10.7 10*3/uL — ABNORMAL HIGH (ref 4.0–10.5)
nRBC: 0 % (ref 0.0–0.2)

## 2021-11-19 LAB — BASIC METABOLIC PANEL
Anion gap: 9 (ref 5–15)
BUN: 18 mg/dL (ref 6–20)
CO2: 26 mmol/L (ref 22–32)
Calcium: 9.6 mg/dL (ref 8.9–10.3)
Chloride: 103 mmol/L (ref 98–111)
Creatinine, Ser: 0.59 mg/dL (ref 0.44–1.00)
GFR, Estimated: >60 mL/min (ref >60)
Glucose, Bld: 104 mg/dL — ABNORMAL HIGH (ref 70–99)
Potassium: 4 mmol/L (ref 3.5–5.1)
Sodium: 138 mmol/L (ref 135–145)

## 2021-11-19 LAB — TROPONIN I (HIGH SENSITIVITY)
Troponin I (High Sensitivity): 7 ng/L (ref <18)
Troponin I (High Sensitivity): 7 ng/L (ref <18)

## 2021-11-19 LAB — TYPE AND SCREEN
ABO/RH(D): A POS
Antibody Screen: NEGATIVE

## 2021-11-19 LAB — LIPASE, BLOOD: Lipase: 26 U/L (ref 11–51)

## 2021-11-19 MED ORDER — PANTOPRAZOLE SODIUM 40 MG PO TBEC: DELAYED_RELEASE_TABLET | ORAL | 0 refills | Status: DC

## 2021-11-19 MED ORDER — IRON 142 (45 FE) MG PO TBCR: 1.0000 | EXTENDED_RELEASE_TABLET | Freq: Every day | ORAL | 1 refills | Status: DC

## 2021-11-19 NOTE — ED Provider Notes (Signed)
[facility] Provider Note    Event Date/Time   First MD Initiated Contact with Patient 11/19/21 1404     (approximate)   History   Abnormal Lab and Shortness of Breath   HPI  Catherine Page is a 61 y.o. female presents the ER from GI clinic due to several months of chest discomfort fatigue and malaise.  Having some exertional dyspnea.  Does have a history of reflux not currently taking any Protonix or an acid medication.  She has not had any melena or hematochezia no vomiting or hematemesis.  She denies any chest pain or pressure at this time.  States she does have a history of anemia and is not currently taking iron supplementation.     Physical Exam   Triage Vital Signs: ED Triage Vitals  Enc Vitals Group     BP 11/19/21 1051 (!) 149/92     Pulse Rate 11/19/21 1051 97     Resp 11/19/21 1051 20     Temp 11/19/21 1051 98.7 F (37.1 C)     Temp Source 11/19/21 1051 Oral     SpO2 11/19/21 1051 97 %     Weight 11/19/21 1053 256 lb (116.1 kg)     Height 11/19/21 1053 5\' 5"  (1.651 m)     Head Circumference --      Peak Flow --      Pain Score 11/19/21 1052 6     Pain Loc --      Pain Edu? --      Excl. in GC? --     Most recent vital signs: Vitals:   11/19/21 1405 11/19/21 1517  BP: 139/89 (!) 148/92  Pulse: 92 84  Resp: 18 15  Temp:  98.5 F (36.9 C)  SpO2: 98% 99%     Constitutional: Alert  Eyes: Conjunctivae are normal.  Head: Atraumatic. Nose: No congestion/rhinnorhea. Mouth/Throat: Mucous membranes are moist.   Neck: Painless ROM.  Cardiovascular:   Good peripheral circulation. Respiratory: Normal respiratory effort.  No retractions.  Gastrointestinal: Soft and nontender.  Musculoskeletal:  no deformity Neurologic:  MAE spontaneously. No gross focal neurologic deficits are appreciated.  Skin:  Skin is warm, dry and intact. No rash noted. Psychiatric: Mood and affect are normal. Speech and behavior are normal.    ED Results /  Procedures / Treatments   Labs (all labs ordered are listed, but only abnormal results are displayed) Labs Reviewed  BASIC METABOLIC PANEL - Abnormal; Notable for the following components:      Result Value   Glucose, Bld 104 (*)    All other components within normal limits  CBC - Abnormal; Notable for the following components:   WBC 10.7 (*)    Hemoglobin 8.4 (*)    HCT 30.1 (*)    MCV 74.1 (*)    MCH 20.7 (*)    MCHC 27.9 (*)    RDW 19.1 (*)    Platelets 466 (*)    All other components within normal limits  HEPATIC FUNCTION PANEL  LIPASE, BLOOD  URINALYSIS, ROUTINE W REFLEX MICROSCOPIC  POC URINE PREG, ED  TYPE AND SCREEN  TROPONIN I (HIGH SENSITIVITY)  TROPONIN I (HIGH SENSITIVITY)     EKG  ED ECG REPORT I, 11/21/21, the attending physician, personally viewed and interpreted this ECG.   Date: 11/19/2021  EKG Time: 10:58  Rate: 95  Rhythm: sinus  Axis: normal  Intervals:normal qt  ST&T Change: no stemi, nonspecific st abn  RADIOLOGY Please see ED Course for my review and interpretation.  I personally reviewed all radiographic images ordered to evaluate for the above acute complaints and reviewed radiology reports and findings.  These findings were personally discussed with the patient.  Please see medical record for radiology report.    PROCEDURES:  Critical Care performed: No  Procedures   MEDICATIONS ORDERED IN ED: Medications - No data to display   IMPRESSION / MDM / ASSESSMENT AND PLAN / ED COURSE  I reviewed the triage vital signs and the nursing notes.                              Differential diagnosis includes, but is not limited to, ACS, pericarditis, esophagitis, anemia,  pe, dissection, pna, bronchitis, costochondritis  Presented to ER for evaluation of several months of symptoms as described above.  She is afebrile hemodynamically stable.  This presenting complaint could reflect a potentially life-threatening illness  therefore the patient will be placed on continuous pulse oximetry and telemetry for monitoring.  Laboratory evaluation will be sent to evaluate for the above complaints.   Her hemoglobin she does show anemia but significant improvement over the past week.  She not having any melena no hematochezia.  Her presentation does not seem consistent with ACS given duration of symptoms with nonischemic EKG and negative troponin will repeat troponin.  Will check lipase as well as LFTs.  Discussed case in consultation with Dr. Tobi Bastos of GI as she was sent over from GI clinic.  Given her symptoms being subacute in nature without evidence of active GI bleeding or drop in hemoglobin this seems more consistent with chronic issue that would be appropriate for outpatient follow-up.  Her chest x-ray on my review and interpretation does not show any evidence of infiltrate or pneumothorax.  Her abdominal exam is otherwise soft and benign.  Will observe from follow-up on repeat labs.  She does not meet criteria for blood transfusion.  Will send prescription for iron supplementation and refill her antiacid medication.   Clinical Course as of 11/19/21 1542  Thu Nov 19, 2021  1516 Patient reassessed.  She is not having any melena or her troponin is normal.  Awaiting lipase and hepatic function. [PR]  1534 Patient's lipase and hepatic function panel are normal.  Does appear clinically stable and appropriate for outpatient follow-up.  Patient agreeable to plan. [PR]    Clinical Course User Index [PR] Willy Eddy, MD    FINAL CLINICAL IMPRESSION(S) / ED DIAGNOSES   Final diagnoses:  Symptomatic anemia     Rx / DC Orders   ED Discharge Orders          Ordered    Ferrous Sulfate (IRON) 142 (45 Fe) MG TBCR  Daily        11/19/21 1537    pantoprazole (PROTONIX) 40 MG tablet       Note to Pharmacy: Please schedule office visit.   11/19/21 1537             Note:  This document was prepared using Dragon voice  recognition software and may include unintentional dictation errors.    Willy Eddy, MD 11/19/21 [phone]

## 2021-11-19 NOTE — ED Provider Triage Note (Signed)
Emergency Medicine Provider Triage Evaluation Note  Catherine Page , a 61 y.o. female  was evaluated in triage.  Pt complains of epigastric abdominal discomfort, shortness of breath, chest tightness and chest pain.  Patient states that her primary care provider recently did blood work and her hemoglobin was 7.3.  Patient states that she does not have a history of symptomatic anemia but might have a bleeding ulcer.  She speculates that she might need an upper endoscopy.  No vomiting or fever.  Review of Systems  Positive: Patient has epigastric abdominal pain, sob and chest pain.  Negative: No fever or vomiting.   Physical Exam  BP (!) 149/92 (BP Location: Left Arm)   Pulse 97   Temp 98.7 F (37.1 C) (Oral)   Resp 20   Ht 5\' 5"  (1.651 m)   Wt 116.1 kg   LMP  (LMP Unknown)   SpO2 97%   BMI 42.60 kg/m  Gen:   Awake, no distress   Resp:  Normal effort  MSK:   Moves extremities without difficulty  Other:    Medical Decision Making  Medically screening exam initiated at 11:12 AM.  Appropriate orders placed.  Catherine Page was informed that the remainder of the evaluation will be completed by another provider, this initial triage assessment does not replace that evaluation, and the importance of remaining in the ED until their evaluation is complete.     Claudina Lick [place], [place] 11/19/21 1114

## 2021-11-19 NOTE — Discharge Instructions (Signed)
Please follow-up with your heart hematology clinic as well as cardiology.  The GI clinic states that they will be contacting you regarding close outpatient follow-up for endoscopy.  Please take iron supplementation as well as multivitamin.  I have also sent a prescription for Protonix to your pharmacy.  Return to the ER immediately if you start noticing any blood or dark black tarry stools.  Return for any additional questions or concerns.

## 2021-11-19 NOTE — ED Notes (Signed)
Pt verbalized understanding of discharge instructions, prescriptions, and follow-up care instruction. Pt advised if symptoms worsen to return to ED.

## 2021-11-19 NOTE — ED Triage Notes (Signed)
Patient was sent from GI for low hemoglobin of 7.3. Patient states she has been having shortness of breath and chest tightness going on for the past 8 months and has seen PCP and GI for issue. Sent here to check blood count and have upper GI scope done.

## 2021-11-25 ENCOUNTER — Inpatient Hospital Stay: Payer: Federal, State, Local not specified - PPO

## 2021-11-25 ENCOUNTER — Inpatient Hospital Stay: Payer: Federal, State, Local not specified - PPO | Admitting: Oncology

## 2021-11-27 ENCOUNTER — Other Ambulatory Visit: Payer: Self-pay

## 2021-11-27 ENCOUNTER — Emergency Department: Payer: Federal, State, Local not specified - PPO

## 2021-11-27 ENCOUNTER — Emergency Department
Admission: EM | Admit: 2021-11-27 | Discharge: 2021-11-27 | Disposition: A | Discharge status: Home / Self Care | Payer: Federal, State, Local not specified - PPO | Attending: Emergency Medicine | Admitting: Emergency Medicine

## 2021-11-27 DIAGNOSIS — R1084 Generalized abdominal pain: Secondary | ICD-10-CM | POA: Diagnosis not present

## 2021-11-27 DIAGNOSIS — R231 Pallor: Secondary | ICD-10-CM | POA: Diagnosis not present

## 2021-11-27 DIAGNOSIS — Z8379 Family history of other diseases of the digestive system: Secondary | ICD-10-CM | POA: Insufficient documentation

## 2021-11-27 DIAGNOSIS — R072 Precordial pain: Secondary | ICD-10-CM | POA: Diagnosis not present

## 2021-11-27 DIAGNOSIS — R1013 Epigastric pain: Secondary | ICD-10-CM | POA: Diagnosis not present

## 2021-11-27 DIAGNOSIS — R112 Nausea with vomiting, unspecified: Secondary | ICD-10-CM | POA: Diagnosis not present

## 2021-11-27 DIAGNOSIS — Z9049 Acquired absence of other specified parts of digestive tract: Secondary | ICD-10-CM | POA: Diagnosis not present

## 2021-11-27 DIAGNOSIS — Z9889 Other specified postprocedural states: Secondary | ICD-10-CM | POA: Diagnosis not present

## 2021-11-27 DIAGNOSIS — K5730 Diverticulosis of large intestine without perforation or abscess without bleeding: Secondary | ICD-10-CM | POA: Diagnosis not present

## 2021-11-27 DIAGNOSIS — R0789 Other chest pain: Secondary | ICD-10-CM | POA: Diagnosis not present

## 2021-11-27 DIAGNOSIS — R079 Chest pain, unspecified: Secondary | ICD-10-CM | POA: Diagnosis not present

## 2021-11-27 DIAGNOSIS — K3189 Other diseases of stomach and duodenum: Secondary | ICD-10-CM | POA: Diagnosis not present

## 2021-11-27 LAB — COMPREHENSIVE METABOLIC PANEL
ALT: 12 U/L (ref 0–44)
AST: 19 U/L (ref 15–41)
Albumin: 3.3 g/dL — ABNORMAL LOW (ref 3.5–5.0)
Alkaline Phosphatase: 140 U/L — ABNORMAL HIGH (ref 38–126)
Anion gap: 10 (ref 5–15)
BUN: 14 mg/dL (ref 6–20)
CO2: 26 mmol/L (ref 22–32)
Calcium: 8.8 mg/dL — ABNORMAL LOW (ref 8.9–10.3)
Chloride: 105 mmol/L (ref 98–111)
Creatinine, Ser: 0.72 mg/dL (ref 0.44–1.00)
GFR, Estimated: >60 mL/min (ref >60)
Glucose, Bld: 184 mg/dL — ABNORMAL HIGH (ref 70–99)
Potassium: 3.5 mmol/L (ref 3.5–5.1)
Sodium: 141 mmol/L (ref 135–145)
Total Bilirubin: 0.4 mg/dL (ref 0.3–1.2)
Total Protein: 7.1 g/dL (ref 6.5–8.1)

## 2021-11-27 LAB — CBC WITH DIFFERENTIAL/PLATELET
Abs Immature Granulocytes: 0.11 10*3/uL — ABNORMAL HIGH (ref 0.00–0.07)
Basophils Absolute: 0.2 10*3/uL — ABNORMAL HIGH (ref 0.0–0.1)
Basophils Relative: 1 %
Eosinophils Absolute: 4.5 10*3/uL — ABNORMAL HIGH (ref 0.0–0.5)
Eosinophils Relative: 25 %
HCT: 28.3 % — ABNORMAL LOW (ref 36.0–46.0)
Hemoglobin: 8.1 g/dL — ABNORMAL LOW (ref 12.0–15.0)
Immature Granulocytes: 1 %
Lymphocytes Relative: 17 %
Lymphs Abs: 3 10*3/uL (ref 0.7–4.0)
MCH: 20.5 pg — ABNORMAL LOW (ref 26.0–34.0)
MCHC: 28.6 g/dL — ABNORMAL LOW (ref 30.0–36.0)
MCV: 71.5 fL — ABNORMAL LOW (ref 80.0–100.0)
Monocytes Absolute: 0.8 10*3/uL (ref 0.1–1.0)
Monocytes Relative: 5 %
Neutro Abs: 9.2 10*3/uL — ABNORMAL HIGH (ref 1.7–7.7)
Neutrophils Relative %: 51 %
Platelets: 504 10*3/uL — ABNORMAL HIGH (ref 150–400)
RBC: 3.96 MIL/uL (ref 3.87–5.11)
RDW: 19.4 % — ABNORMAL HIGH (ref 11.5–15.5)
Smear Review: NORMAL
WBC: 17.9 10*3/uL — ABNORMAL HIGH (ref 4.0–10.5)
nRBC: 0 % (ref 0.0–0.2)

## 2021-11-27 LAB — TYPE AND SCREEN
ABO/RH(D): A POS
Antibody Screen: NEGATIVE

## 2021-11-27 LAB — TROPONIN I (HIGH SENSITIVITY)
Troponin I (High Sensitivity): 8 ng/L (ref <18)
Troponin I (High Sensitivity): 6 ng/L (ref <18)

## 2021-11-27 LAB — LIPASE, BLOOD: Lipase: 23 U/L (ref 11–51)

## 2021-11-27 LAB — PATHOLOGIST SMEAR REVIEW

## 2021-11-27 MED ORDER — ONDANSETRON HCL 4 MG/2ML IJ SOLN
4.0000 mg | Freq: Once | INTRAMUSCULAR | Status: AC
Start: 2021-11-27 — End: 2021-11-27
  Administered 2021-11-27: 4 mg via INTRAVENOUS
  Filled 2021-11-27: qty 2

## 2021-11-27 MED ORDER — SUCRALFATE 1 G PO TABS: 1.0000 g | ORAL_TABLET | Freq: Four times a day (QID) | ORAL | 0 refills | Status: DC

## 2021-11-27 MED ORDER — ALUM & MAG HYDROXIDE-SIMETH 200-200-20 MG/5ML PO SUSP
30.0000 mL | Freq: Once | ORAL | Status: AC
  Administered 2021-11-27: 30 mL via ORAL
  Filled 2021-11-27: qty 30

## 2021-11-27 MED ORDER — IOHEXOL 350 MG/ML SOLN
100.0000 mL | Freq: Once | INTRAVENOUS | Status: AC | PRN
  Administered 2021-11-27: 100 mL via INTRAVENOUS

## 2021-11-27 NOTE — ED Triage Notes (Signed)
Pt presents to ER via ems from home with c/o chest pain since yesterday around 1800.  Pt states pain radiates into stomach and back and is intermittent in nature.  Pt endorses some sob as well.  Pt seen here recently and told she might have an slow bleed from an ulcer.  Pt noted to be pale and diaphoretic in triage.  Pt given NTG w/ems without relief of pain.  Pt is A&O x4 at this time in NAD.  Pt given appx 300 cc NS w/ems.

## 2021-11-27 NOTE — ED Provider Notes (Addendum)
[facility] Provider Note   Event Date/Time   First MD Initiated Contact with Patient 11/27/21 [phone]     (approximate) History  Chest Pain  HPI Catherine Page is a 61 y.o. female with a stated past medical history of severe GERD status post Nissen fundoplication who presents for epigastric pain and substernal chest pain that has been intermittent for her over the last few years but worsened this morning.  Patient states that this pain is similar to GERD pain that she has had in the past however it is worsening in frequency and in intensity.  Patient states that she was supposed to be following up with GI for an upper endoscopy but has been unable to get it scheduled.  Over the last week patient does also endorse nausea/vomiting/diarrhea.  Patient states that her diarrhea resolved as she took Imodium over the last 2 days ROS: Patient currently denies any vision changes, tinnitus, difficulty speaking, facial droop, sore throat, chest pain, shortness of breath, dysuria, or weakness/numbness/paresthesias in any extremity   Physical Exam  Triage Vital Signs: ED Triage Vitals  Enc Vitals Group     BP 11/27/21 0632 115/75     Pulse Rate 11/27/21 0632 96     Resp 11/27/21 0632 20     Temp 11/27/21 0632 99.2 F (37.3 C)     Temp Source 11/27/21 0632 Oral     SpO2 11/27/21 0632 94 %     Weight 11/27/21 0630 255 lb (115.7 kg)     Height 11/27/21 0630 5\' 5"  (1.651 m)     Head Circumference --      Peak Flow --      Pain Score 11/27/21 0629 9     Pain Loc --      Pain Edu? --      Excl. in GC? --    Most recent vital signs: Vitals:   11/27/21 1045 11/27/21 1050  BP: (!) 141/91 (!) 141/91  Pulse: 92 92  Resp:  19  Temp:    SpO2: 99% 99%   General: Awake, oriented x4. CV:  Good peripheral perfusion.  Resp:  Normal effort.  Abd:  No distention.  Other:  Obese Caucasian middle-aged female laying in bed in no acute distress ED Results / Procedures / Treatments   Labs (all labs ordered are listed, but only abnormal results are displayed) Labs Reviewed  CBC WITH DIFFERENTIAL/PLATELET - Abnormal; Notable for the following components:      Result Value   WBC 17.9 (*)    Hemoglobin 8.1 (*)    HCT 28.3 (*)    MCV 71.5 (*)    MCH 20.5 (*)    MCHC 28.6 (*)    RDW 19.4 (*)    Platelets 504 (*)    Neutro Abs 9.2 (*)    Eosinophils Absolute 4.5 (*)    Basophils Absolute 0.2 (*)    Abs Immature Granulocytes 0.11 (*)    All other components within normal limits  COMPREHENSIVE METABOLIC PANEL - Abnormal; Notable for the following components:   Glucose, Bld 184 (*)    Calcium 8.8 (*)    Albumin 3.3 (*)    Alkaline Phosphatase 140 (*)    All other components within normal limits  LIPASE, BLOOD  PATHOLOGIST SMEAR REVIEW  TYPE AND SCREEN  TROPONIN I (HIGH SENSITIVITY)  TROPONIN I (HIGH SENSITIVITY)   EKG ED ECG REPORT I, 11/29/21, the attending physician, personally viewed and interpreted this ECG.  Date: 11/27/2021 EKG Time: 0627 Rate: 105 Rhythm: Tachycardic sinus rhythm QRS Axis: normal Intervals: normal ST/T Wave abnormalities: normal Narrative Interpretation: Tachycardic sinus rhythm.  No evidence of acute ischemia RADIOLOGY ED MD interpretation: One-view portable chest x-ray interpreted by me shows no evidence of acute abnormalities including no pneumonia, pneumothorax, or widened mediastinum -Agree with radiology assessment Official radiology report(s): CT ANGIO GI BLEED  Result Date: 11/27/2021 CLINICAL DATA:  GI bleed EXAM: CTA ABDOMEN AND PELVIS WITHOUT AND WITH CONTRAST TECHNIQUE: Multidetector CT imaging of the abdomen and pelvis was performed using the standard protocol during bolus administration of intravenous contrast. Multiplanar reconstructed images and MIPs were obtained and reviewed to evaluate the vascular anatomy. RADIATION DOSE REDUCTION: This exam was performed according to the departmental dose-optimization  program which includes automated exposure control, adjustment of the mA and/or kV according to patient size and/or use of iterative reconstruction technique. CONTRAST:  100 mL OMNIPAQUE IOHEXOL 350 MG/ML SOLN COMPARISON:  None available FINDINGS: VASCULAR Aorta: Normal caliber aorta without aneurysm, dissection, vasculitis or significant stenosis. Celiac: Patent without evidence of aneurysm, dissection, vasculitis or significant stenosis. SMA: Patent without evidence of aneurysm, dissection, vasculitis or significant stenosis. Renals: Both renal arteries are patent without evidence of aneurysm, dissection, vasculitis, fibromuscular dysplasia or significant stenosis. IMA: Patent without evidence of aneurysm, dissection, vasculitis or significant stenosis. Inflow: Patent without evidence of aneurysm, dissection, vasculitis or significant stenosis. Proximal Outflow: Bilateral common femoral and visualized portions of the superficial and profunda femoral arteries are patent without evidence of aneurysm, dissection, vasculitis or significant stenosis. Veins: Hepatic, portal, superior mesenteric, splenic veins are patent. Review of the MIP images confirms the above findings. NON-VASCULAR Lower chest: Mild emphysematous changes of the lung bases. No acute abnormality. 3 mm subpleural nodule in the left lateral lung base is most likely an intrapulmonary lymph node and does not require further follow-up. Hepatobiliary: No focal liver abnormality is seen. No gallstones, gallbladder wall thickening, or biliary dilatation. Pancreas: Unremarkable. No pancreatic ductal dilatation or surrounding inflammatory changes. Spleen: Normal in size without focal abnormality. Adrenals/Urinary Tract: Adrenal glands are unremarkable. Kidneys are normal, without renal calculi, focal lesion, or hydronephrosis. Bladder is unremarkable. Stomach/Bowel: Multiple surgical clips at the gastroesophageal junction suggestive of prior niece in  fundoplication. The stomach is moderately distended and fluid-filled. Hyperdense material in the distal gastric antrum lumen is likely ingested medication. No bowel dilatation to indicate ileus or obstruction. Diverticulosis of the descending and sigmoid colon without evidence of acute diverticulitis. Ovoid fat density lesion adjacent to the sigmoid colon best seen on image 217 of series 10 measuring 1.6 x 1.0 cm consistent with prior episode of epiploic appendagitis. No hemorrhage identified within the gastrointestinal track on arterial or delayed phase images. Postsurgical changes of appendectomy are seen. Lymphatic: No enlarged abdominal or pelvic lymph nodes. Reproductive: Uterus and bilateral adnexa are unremarkable. Other: No abdominal wall hernia or abnormality. No abdominopelvic ascites. Musculoskeletal: No acute or significant osseous findings. IMPRESSION: VASCULAR 1. No active gastrointestinal hemorrhage. 2. No significant abnormality of mesenteric arteries or veins. NON-VASCULAR 1. No acute abnormality of the abdomen or pelvis. 2. Mild diverticulosis of the descending and sigmoid colon without evidence of acute diverticulitis. Electronically Signed   By: Acquanetta Belling M.D.   On: 11/27/2021 09:55   DG Chest Port 1 View  Result Date: 11/27/2021 CLINICAL DATA:  61 year old female with history of chest pain since yesterday. EXAM: PORTABLE CHEST 1 VIEW COMPARISON:  Chest x-ray 11/19/2021. FINDINGS: Lung volumes are normal. No consolidative  airspace disease. No pleural effusions. No pneumothorax. No pulmonary nodule or mass noted. Pulmonary vasculature and the cardiomediastinal silhouette are within normal limits. IMPRESSION: No radiographic evidence of acute cardiopulmonary disease. Electronically Signed   By: Trudie Reed M.D.   On: 11/27/2021 07:09   PROCEDURES: Critical Care performed: No .1-3 Lead EKG Interpretation  Performed by: Merwyn Katos, MD Authorized by: Merwyn Katos, MD      Interpretation: normal     ECG rate:  90   ECG rate assessment: normal     Rhythm: sinus rhythm     Ectopy: none     Conduction: normal    MEDICATIONS ORDERED IN ED: Medications  iohexol (OMNIPAQUE) 350 MG/ML injection 100 mL (100 mLs Intravenous Contrast Given 11/27/21 0924)  alum & mag hydroxide-simeth (MAALOX/MYLANTA) 200-200-20 MG/5ML suspension 30 mL (30 mLs Oral Given 11/27/21 1015)  ondansetron (ZOFRAN) injection 4 mg (4 mg Intravenous Given 11/27/21 1042)   IMPRESSION / MDM / ASSESSMENT AND PLAN / ED COURSE  I reviewed the triage vital signs and the nursing notes.                             The patient is on the cardiac monitor to evaluate for evidence of arrhythmia and/or significant heart rate changes. Patient's presentation is most consistent with acute presentation with potential threat to life or bodily function. Patients symptoms not typical for emergent causes of abdominal pain such as, but not limited to, appendicitis, abdominal aortic aneurysm, surgical biliary disease, pancreatitis, SBO, mesenteric ischemia, serious intra-abdominal bacterial illness. Presentation also not typical of gynecologic emergencies such as TOA, Ovarian Torsion, PID. Not Ectopic. Doubt atypical ACS. CT angiography of the abdomen does not show any evidence of active large GI hemorrhage.  There is no melena or hematochezia.  Low risk for acute GI bleeding at this time as patient's hemoglobin is stable.  Patient will need follow-up within the next 1-3 days with PCP or with GI for likely upper GI study. Pt tolerating PO. Disposition: Patient will be discharged with strict return precautions and follow up with primary MD within 12-24 hours for further evaluation. Patient understands that this still may have an early presentation of an emergent medical condition such as appendicitis that will require a recheck.   FINAL CLINICAL IMPRESSION(S) / ED DIAGNOSES   Final diagnoses:  Epigastric pain  Family  history of GERD  Atypical chest pain   Rx / DC Orders   ED Discharge Orders          Ordered    sucralfate (CARAFATE) 1 g tablet  4 times daily        11/27/21 1104           Note:  This document was prepared using Dragon voice recognition software and may include unintentional dictation errors.   Merwyn Katos, MD 11/27/21 1111    Merwyn Katos, MD 11/27/21 1115

## 2021-12-03 ENCOUNTER — Inpatient Hospital Stay: Payer: Federal, State, Local not specified - PPO | Admitting: Oncology

## 2021-12-03 ENCOUNTER — Inpatient Hospital Stay: Payer: Federal, State, Local not specified - PPO

## 2021-12-03 ENCOUNTER — Encounter: Payer: Self-pay | Admitting: Obstetrics & Gynecology

## 2021-12-07 ENCOUNTER — Inpatient Hospital Stay: Payer: Federal, State, Local not specified - PPO | Attending: Oncology | Admitting: Oncology

## 2021-12-07 ENCOUNTER — Encounter: Payer: Self-pay | Admitting: Oncology

## 2021-12-07 ENCOUNTER — Inpatient Hospital Stay: Payer: Federal, State, Local not specified - PPO

## 2021-12-07 VITALS — BP 155/87 | HR 110 | Temp 98.7°F | Resp 20 | Wt 261.3 lb

## 2021-12-07 DIAGNOSIS — D509 Iron deficiency anemia, unspecified: Secondary | ICD-10-CM | POA: Insufficient documentation

## 2021-12-07 NOTE — Progress Notes (Unsigned)
Hematology/Oncology Consult note [phone] [phone]      Patient Care Team: Duanne Limerick, MD as PCP - General (Family Medicine) Rickard Patience, MD as Consulting Physician (Oncology)   REFERRING PROVIDER: Duanne Limerick, MD  CHIEF COMPLAINTS/REASON FOR VISIT:  Anemia  ASSESSMENT & PLAN:  No problem-specific Assessment & Plan notes found for this encounter.  Orders Placed This Encounter  Procedures   CBC with Differential/Platelet    Standing Status:   Future    Standing Expiration Date:   12/08/2022   Iron and TIBC([facility])   Ferritin    Standing Status:   Future    Standing Expiration Date:   12/08/2022   Retic Panel    Standing Status:   Future    Standing Expiration Date:   12/08/2022    All questions were answered. The patient knows to call the clinic with any problems, questions or concerns.  Rickard Patience, MD, PhD [facility] Hematology Oncology 12/07/2021     HISTORY OF PRESENTING ILLNESS:  Catherine Page is a  61 y.o.  female with PMH listed below who was referred to me for anemia Reviewed patient's recent labs that was done.  She was found to have abnormal CBC on  Reviewed patient's previous labs ordered by primary care physician's office, anemia is chronic onset , duration is since  No aggravating or improving factors.  She denies recent chest pain on exertion, shortness of breath on minimal exertion, pre-syncopal episodes, or palpitations She had not noticed any recent bleeding such as epistaxis, hematuria or hematochezia.  She denies over the counter NSAID ingestion. She is not *** on antiplatelets agents. Her last colonoscopy was  She denies any pica and eats a variety of diet.    MEDICAL HISTORY:  Past Medical History:  Diagnosis Date   Depression    Insomnia     SURGICAL HISTORY: Past Surgical History:  Procedure Laterality Date   FOOT SURGERY     SHOULDER SURGERY Left 05/03/2018    SOCIAL HISTORY: Social History    Socioeconomic History   Marital status: Single    Spouse name: Not on file   Number of children: Not on file   Years of education: Not on file   Highest education level: Not on file  Occupational History   Not on file  Tobacco Use   Smoking status: Never   Smokeless tobacco: Never  Vaping Use   Vaping Use: Never used  Substance and Sexual Activity   Alcohol use: Not Currently    Comment: rarely   Drug use: Never   Sexual activity: Not Currently  Other Topics Concern   Not on file  Social History Narrative   Not on file   Social Determinants of Health   Financial Resource Strain: Not on file  Food Insecurity: Not on file  Transportation Needs: Not on file  Physical Activity: Not on file  Stress: Not on file  Social Connections: Not on file  Intimate Partner Violence: Not on file    FAMILY HISTORY: Family History  Problem Relation Age of Onset   Alzheimer's disease Mother    Dementia Mother    Hypertension Mother    Diabetes Mother    Stroke Mother    Other Father 44       committed suicide   Heart attack Father    Heart disease Father    Stroke Father    Breast cancer Neg Hx     ALLERGIES:  is allergic to  no known allergies and other.  MEDICATIONS:  Current Outpatient Medications  Medication Sig Dispense Refill   alprazolam (XANAX) 2 MG tablet Take 2 mg by mouth at bedtime as needed for sleep. CBC     amphetamine-dextroamphetamine (ADDERALL) 20 MG tablet Take 20 mg by mouth 2 (two) times daily. CBC     ARIPiprazole (ABILIFY) 2 MG tablet Take 2 mg by mouth daily. CBC     desvenlafaxine (PRISTIQ) 100 MG 24 hr tablet Take 100 mg by mouth daily. CBC     Ferrous Sulfate (IRON) 142 (45 Fe) MG TBCR Take 1 tablet by mouth daily. 30 tablet 1   pantoprazole (PROTONIX) 40 MG tablet TAKE 1 TABLET(40 MG) BY MOUTH DAILY 90 tablet 0   sucralfate (CARAFATE) 1 g tablet Take 1 tablet (1 g total) by mouth 4 (four) times daily. 120 tablet 0   zolpidem (AMBIEN) 10 MG  tablet Take 12.5 mg by mouth at bedtime as needed for sleep. CBC     No current facility-administered medications for this visit.    Review of Systems - Oncology  PHYSICAL EXAMINATION: ECOG PERFORMANCE STATUS: {CHL ONC ECOG ER:[id number]} Vitals:   12/07/21 1545  BP: (!) 155/87  Pulse: (!) 110  Resp: 20  Temp: 98.7 F (37.1 C)  SpO2: 100%   Filed Weights   12/07/21 1545  Weight: 261 lb 4.8 oz (118.5 kg)    Physical Exam   LABORATORY DATA:  I have reviewed the data as listed    Latest Ref Rng & Units 11/27/2021    6:38 AM 11/19/2021   11:07 AM 11/12/2021    4:16 PM  CBC  WBC 4.0 - 10.5 K/uL 17.9  10.7  8.5   Hemoglobin 12.0 - 15.0 g/dL 8.1  8.4  7.8   Hematocrit 36.0 - 46.0 % 28.3  30.1  27.6   Platelets 150 - 400 K/uL 504  466  483       Latest Ref Rng & Units 11/27/2021    6:38 AM 11/19/2021    2:36 PM 11/19/2021   11:07 AM  CMP  Glucose 70 - 99 mg/dL 950   932   BUN 6 - 20 mg/dL 14   18   Creatinine 6.71 - 1.00 mg/dL 2.45   8.09   Sodium 983 - 145 mmol/L 141   138   Potassium 3.5 - 5.1 mmol/L 3.5   4.0   Chloride 98 - 111 mmol/L 105   103   CO2 22 - 32 mmol/L 26   26   Calcium 8.9 - 10.3 mg/dL 8.8   9.6   Total Protein 6.5 - 8.1 g/dL 7.1  6.8  C   Total Bilirubin 0.3 - 1.2 mg/dL 0.4  0.4  C   Alkaline Phos 38 - 126 U/L 140  107  C   AST 15 - 41 U/L 19  16  C   ALT 0 - 44 U/L 12  12  C     C Corrected result      Component Value Date/Time   FERRITIN 6 (L) 11/12/2021 1616   FERRITIN 10 04/29/2020 0000     RADIOGRAPHIC STUDIES: I have personally reviewed the radiological images as listed and agreed with the findings in the report. CT ANGIO GI BLEED  Result Date: 11/27/2021 CLINICAL DATA:  GI bleed EXAM: CTA ABDOMEN AND PELVIS WITHOUT AND WITH CONTRAST TECHNIQUE: Multidetector CT imaging of the abdomen and pelvis was performed using the standard protocol during bolus administration  of intravenous contrast. Multiplanar reconstructed images and MIPs were  obtained and reviewed to evaluate the vascular anatomy. RADIATION DOSE REDUCTION: This exam was performed according to the departmental dose-optimization program which includes automated exposure control, adjustment of the mA and/or kV according to patient size and/or use of iterative reconstruction technique. CONTRAST:  100 mL OMNIPAQUE IOHEXOL 350 MG/ML SOLN COMPARISON:  None available FINDINGS: VASCULAR Aorta: Normal caliber aorta without aneurysm, dissection, vasculitis or significant stenosis. Celiac: Patent without evidence of aneurysm, dissection, vasculitis or significant stenosis. SMA: Patent without evidence of aneurysm, dissection, vasculitis or significant stenosis. Renals: Both renal arteries are patent without evidence of aneurysm, dissection, vasculitis, fibromuscular dysplasia or significant stenosis. IMA: Patent without evidence of aneurysm, dissection, vasculitis or significant stenosis. Inflow: Patent without evidence of aneurysm, dissection, vasculitis or significant stenosis. Proximal Outflow: Bilateral common femoral and visualized portions of the superficial and profunda femoral arteries are patent without evidence of aneurysm, dissection, vasculitis or significant stenosis. Veins: Hepatic, portal, superior mesenteric, splenic veins are patent. Review of the MIP images confirms the above findings. NON-VASCULAR Lower chest: Mild emphysematous changes of the lung bases. No acute abnormality. 3 mm subpleural nodule in the left lateral lung base is most likely an intrapulmonary lymph node and does not require further follow-up. Hepatobiliary: No focal liver abnormality is seen. No gallstones, gallbladder wall thickening, or biliary dilatation. Pancreas: Unremarkable. No pancreatic ductal dilatation or surrounding inflammatory changes. Spleen: Normal in size without focal abnormality. Adrenals/Urinary Tract: Adrenal glands are unremarkable. Kidneys are normal, without renal calculi, focal lesion,  or hydronephrosis. Bladder is unremarkable. Stomach/Bowel: Multiple surgical clips at the gastroesophageal junction suggestive of prior niece in fundoplication. The stomach is moderately distended and fluid-filled. Hyperdense material in the distal gastric antrum lumen is likely ingested medication. No bowel dilatation to indicate ileus or obstruction. Diverticulosis of the descending and sigmoid colon without evidence of acute diverticulitis. Ovoid fat density lesion adjacent to the sigmoid colon best seen on image 217 of series 10 measuring 1.6 x 1.0 cm consistent with prior episode of epiploic appendagitis. No hemorrhage identified within the gastrointestinal track on arterial or delayed phase images. Postsurgical changes of appendectomy are seen. Lymphatic: No enlarged abdominal or pelvic lymph nodes. Reproductive: Uterus and bilateral adnexa are unremarkable. Other: No abdominal wall hernia or abnormality. No abdominopelvic ascites. Musculoskeletal: No acute or significant osseous findings. IMPRESSION: VASCULAR 1. No active gastrointestinal hemorrhage. 2. No significant abnormality of mesenteric arteries or veins. NON-VASCULAR 1. No acute abnormality of the abdomen or pelvis. 2. Mild diverticulosis of the descending and sigmoid colon without evidence of acute diverticulitis. Electronically Signed   By: Acquanetta Belling M.D.   On: 11/27/2021 09:55   DG Chest Port 1 View  Result Date: 11/27/2021 CLINICAL DATA:  61 year old female with history of chest pain since yesterday. EXAM: PORTABLE CHEST 1 VIEW COMPARISON:  Chest x-ray 11/19/2021. FINDINGS: Lung volumes are normal. No consolidative airspace disease. No pleural effusions. No pneumothorax. No pulmonary nodule or mass noted. Pulmonary vasculature and the cardiomediastinal silhouette are within normal limits. IMPRESSION: No radiographic evidence of acute cardiopulmonary disease. Electronically Signed   By: Trudie Reed M.D.   On: 11/27/2021 07:09   DG  Chest 2 View  Result Date: 11/19/2021 CLINICAL DATA:  Chest pain EXAM: CHEST - 2 VIEW COMPARISON:  None Available. FINDINGS: The heart size and mediastinal contours are within normal limits. Both lungs are clear. The visualized skeletal structures are unremarkable. IMPRESSION: No active cardiopulmonary disease. Electronically Signed   By: Rhae Hammock  Rathinasamy M.D.   On: 11/19/2021 11:41

## 2021-12-08 ENCOUNTER — Encounter: Payer: Self-pay | Admitting: Oncology

## 2021-12-08 NOTE — Assessment & Plan Note (Signed)
Labs are reviewed and discussed with patient. I discussed about option of continue oral iron supplementation and repeat blood work for evaluation of treatment response.  If no significant improvement or worse, then proceed with IV Venofer treatments. Alternative option of proceed with IV Venofer treatments. I discussed about the potential risks including but not limited to allergic reactions/infusion reactions including anaphylactic reactions, phlebitis, high blood pressure, wheezing, SOB, skin rash, weight gain, leg swelling, headache, nausea and fatigue, etc. Patient desires to achieved higher level of iron faster for adequate hematopoesis. Plan IV venofer weekly x 5

## 2021-12-10 ENCOUNTER — Inpatient Hospital Stay: Payer: Federal, State, Local not specified - PPO

## 2021-12-10 VITALS — BP 140/81 | HR 76 | Temp 98.5°F | Resp 20

## 2021-12-10 DIAGNOSIS — D500 Iron deficiency anemia secondary to blood loss (chronic): Secondary | ICD-10-CM

## 2021-12-10 DIAGNOSIS — D509 Iron deficiency anemia, unspecified: Secondary | ICD-10-CM | POA: Diagnosis not present

## 2021-12-10 MED ORDER — SODIUM CHLORIDE 0.9 % IV SOLN
200.0000 mg | Freq: Once | INTRAVENOUS | Status: AC
  Administered 2021-12-10: 200 mg via INTRAVENOUS
  Filled 2021-12-10: qty 200

## 2021-12-10 MED ORDER — SODIUM CHLORIDE 0.9 % IV SOLN
Freq: Once | INTRAVENOUS | Status: AC
  Filled 2021-12-10: qty 250

## 2021-12-11 DIAGNOSIS — E7801 Familial hypercholesterolemia: Secondary | ICD-10-CM | POA: Diagnosis not present

## 2021-12-11 DIAGNOSIS — R0602 Shortness of breath: Secondary | ICD-10-CM | POA: Diagnosis not present

## 2021-12-11 DIAGNOSIS — G4733 Obstructive sleep apnea (adult) (pediatric): Secondary | ICD-10-CM | POA: Diagnosis not present

## 2021-12-17 ENCOUNTER — Inpatient Hospital Stay: Payer: Federal, State, Local not specified - PPO

## 2021-12-17 VITALS — BP 147/81 | HR 82 | Temp 98.8°F | Resp 20

## 2021-12-17 DIAGNOSIS — D500 Iron deficiency anemia secondary to blood loss (chronic): Secondary | ICD-10-CM

## 2021-12-17 DIAGNOSIS — D509 Iron deficiency anemia, unspecified: Secondary | ICD-10-CM | POA: Diagnosis not present

## 2021-12-17 MED ORDER — SODIUM CHLORIDE 0.9 % IV SOLN
Freq: Once | INTRAVENOUS | Status: AC
  Filled 2021-12-17: qty 250

## 2021-12-17 MED ORDER — SODIUM CHLORIDE 0.9 % IV SOLN
200.0000 mg | Freq: Once | INTRAVENOUS | Status: AC
  Administered 2021-12-17: 200 mg via INTRAVENOUS
  Filled 2021-12-17: qty 200

## 2021-12-22 ENCOUNTER — Ambulatory Visit: Payer: Federal, State, Local not specified - PPO | Admitting: Family Medicine

## 2021-12-22 ENCOUNTER — Other Ambulatory Visit: Payer: Self-pay

## 2021-12-23 ENCOUNTER — Encounter: Payer: Self-pay | Admitting: Family Medicine

## 2021-12-24 ENCOUNTER — Inpatient Hospital Stay: Payer: Federal, State, Local not specified - PPO

## 2021-12-24 VITALS — BP 127/78 | HR 92 | Temp 98.2°F | Resp 18

## 2021-12-24 DIAGNOSIS — D500 Iron deficiency anemia secondary to blood loss (chronic): Secondary | ICD-10-CM

## 2021-12-24 DIAGNOSIS — D509 Iron deficiency anemia, unspecified: Secondary | ICD-10-CM | POA: Diagnosis not present

## 2021-12-24 MED ORDER — SODIUM CHLORIDE 0.9 % IV SOLN
200.0000 mg | Freq: Once | INTRAVENOUS | Status: AC
  Administered 2021-12-24: 200 mg via INTRAVENOUS
  Filled 2021-12-24: qty 200

## 2021-12-24 MED ORDER — SODIUM CHLORIDE 0.9 % IV SOLN
Freq: Once | INTRAVENOUS | Status: AC
  Filled 2021-12-24: qty 250

## 2021-12-31 ENCOUNTER — Inpatient Hospital Stay: Payer: Federal, State, Local not specified - PPO

## 2021-12-31 VITALS — BP 134/88 | HR 75 | Temp 98.1°F | Resp 18

## 2021-12-31 DIAGNOSIS — D509 Iron deficiency anemia, unspecified: Secondary | ICD-10-CM | POA: Diagnosis not present

## 2021-12-31 DIAGNOSIS — D500 Iron deficiency anemia secondary to blood loss (chronic): Secondary | ICD-10-CM

## 2021-12-31 MED ORDER — SODIUM CHLORIDE 0.9 % IV SOLN
200.0000 mg | Freq: Once | INTRAVENOUS | Status: AC
  Administered 2021-12-31: 200 mg via INTRAVENOUS
  Filled 2021-12-31: qty 200

## 2021-12-31 MED ORDER — SODIUM CHLORIDE 0.9 % IV SOLN
Freq: Once | INTRAVENOUS | Status: AC
  Filled 2021-12-31: qty 250

## 2021-12-31 NOTE — Patient Instructions (Signed)
[facility]  Discharge Instructions: Thank you for choosing [facility] to provide your oncology and hematology care.  If you have a lab appointment with the Cancer Center, please go directly to the Cancer Center and check in at the registration area.  Wear comfortable clothing and clothing appropriate for easy access to any Portacath or PICC line.   We strive to give you quality time with your provider. You may need to reschedule your appointment if you arrive late (15 or more minutes).  Arriving late affects you and other patients whose appointments are after yours.  Also, if you miss three or more appointments without notifying the office, you may be dismissed from the clinic at the provider's discretion.      For prescription refill requests, have your pharmacy contact our office and allow 72 hours for refills to be completed.    Today you received the following chemotherapy and/or immunotherapy agents VENOFER      To help prevent nausea and vomiting after your treatment, we encourage you to take your nausea medication as directed.  BELOW ARE SYMPTOMS THAT SHOULD BE REPORTED IMMEDIATELY: *FEVER GREATER THAN 100.4 F (38 C) OR HIGHER *CHILLS OR SWEATING *NAUSEA AND VOMITING THAT IS NOT CONTROLLED WITH YOUR NAUSEA MEDICATION *UNUSUAL SHORTNESS OF BREATH *UNUSUAL BRUISING OR BLEEDING *URINARY PROBLEMS (pain or burning when urinating, or frequent urination) *BOWEL PROBLEMS (unusual diarrhea, constipation, pain near the anus) TENDERNESS IN MOUTH AND THROAT WITH OR WITHOUT PRESENCE OF ULCERS (sore throat, sores in mouth, or a toothache) UNUSUAL RASH, SWELLING OR PAIN  UNUSUAL VAGINAL DISCHARGE OR ITCHING   Items with * indicate a potential emergency and should be followed up as soon as possible or go to the Emergency Department if any problems should occur.  Please show the CHEMOTHERAPY ALERT CARD or IMMUNOTHERAPY ALERT CARD at check-in to the  Emergency Department and triage nurse.  Should you have questions after your visit or need to cancel or reschedule your appointment, please contact [facility]  [phone] and follow the prompts.  Office hours are 8:00 a.m. to 4:30 p.m. Monday - Friday. Please note that voicemails left after 4:00 p.m. may not be returned until the following business day.  We are closed weekends and major holidays. You have access to a nurse at all times for urgent questions. Please call the main number to the clinic [phone] and follow the prompts.  For any non-urgent questions, you may also contact your provider using MyChart. We now offer e-Visits for anyone 18 and older to request care online for non-urgent symptoms. For details visit [email or web address].   Also download the MyChart app! Go to the app store, search "MyChart", open the app, select , and log in with your MyChart username and password.  Masks are optional in the cancer centers. If you would like for your care team to wear a mask while they are taking care of you, please let them know. For doctor visits, patients may have with them one support person who is at least 59 years old. At this time, visitors are not allowed in the infusion area.  Iron Sucrose Injection What is this medication? IRON SUCROSE (EYE ern SOO krose) treats low levels of iron (iron deficiency anemia) in people with kidney disease. Iron is a mineral that plays an important role in making red blood cells, which carry oxygen from your lungs to the rest of your body. This medicine may be   used for other purposes; ask your health care provider or pharmacist if you have questions. COMMON BRAND NAME(S): Venofer What should I tell my care team before I take this medication? They need to know if you have any of these conditions: Anemia not caused by low iron levels Heart disease High levels of iron in the blood Kidney disease Liver  disease An unusual or allergic reaction to iron, other medications, foods, dyes, or preservatives Pregnant or trying to get pregnant Breast-feeding How should I use this medication? This medication is for infusion into a vein. It is given in a hospital or clinic setting. Talk to your care team about the use of this medication in children. While this medication may be prescribed for children as young as 2 years for selected conditions, precautions do apply. Overdosage: If you think you have taken too much of this medicine contact a poison control center or emergency room at once. NOTE: This medicine is only for you. Do not share this medicine with others. What if I miss a dose? It is important not to miss your dose. Call your care team if you are unable to keep an appointment. What may interact with this medication? Do not take this medication with any of the following: Deferoxamine Dimercaprol Other iron products This medication may also interact with the following: Chloramphenicol Deferasirox This list may not describe all possible interactions. Give your health care provider a list of all the medicines, herbs, non-prescription drugs, or dietary supplements you use. Also tell them if you smoke, drink alcohol, or use illegal drugs. Some items may interact with your medicine. What should I watch for while using this medication? Visit your care team regularly. Tell your care team if your symptoms do not start to get better or if they get worse. You may need blood work done while you are taking this medication. You may need to follow a special diet. Talk to your care team. Foods that contain iron include: whole grains/cereals, dried fruits, beans, or peas, leafy green vegetables, and organ meats (liver, kidney). What side effects may I notice from receiving this medication? Side effects that you should report to your care team as soon as possible: Allergic reactions--skin rash, itching, hives,  swelling of the face, lips, tongue, or throat Low blood pressure--dizziness, feeling faint or lightheaded, blurry vision Shortness of breath Side effects that usually do not require medical attention (report to your care team if they continue or are bothersome): Flushing Headache Joint pain Muscle pain Nausea Pain, redness, or irritation at injection site This list may not describe all possible side effects. Call your doctor for medical advice about side effects. You may report side effects to FDA at 1-800-FDA-1088. Where should I keep my medication? This medication is given in a hospital or clinic and will not be stored at home. NOTE: This sheet is a summary. It may not cover all possible information. If you have questions about this medicine, talk to your doctor, pharmacist, or health care provider.  2023 Elsevier/Gold Standard (2007-06-10 00:00:00)   

## 2022-01-07 ENCOUNTER — Inpatient Hospital Stay: Payer: Federal, State, Local not specified - PPO | Attending: Oncology

## 2022-01-07 VITALS — BP 147/86 | HR 88 | Temp 98.0°F | Resp 18

## 2022-01-07 DIAGNOSIS — G4719 Other hypersomnia: Secondary | ICD-10-CM | POA: Diagnosis not present

## 2022-01-07 DIAGNOSIS — R0602 Shortness of breath: Secondary | ICD-10-CM | POA: Diagnosis not present

## 2022-01-07 DIAGNOSIS — D500 Iron deficiency anemia secondary to blood loss (chronic): Secondary | ICD-10-CM

## 2022-01-07 DIAGNOSIS — D509 Iron deficiency anemia, unspecified: Secondary | ICD-10-CM | POA: Diagnosis not present

## 2022-01-07 DIAGNOSIS — J4540 Moderate persistent asthma, uncomplicated: Secondary | ICD-10-CM | POA: Diagnosis not present

## 2022-01-07 MED ORDER — SODIUM CHLORIDE 0.9 % IV SOLN
200.0000 mg | Freq: Once | INTRAVENOUS | Status: AC
  Administered 2022-01-07: 200 mg via INTRAVENOUS
  Filled 2022-01-07: qty 200

## 2022-01-07 MED ORDER — SODIUM CHLORIDE 0.9 % IV SOLN
Freq: Once | INTRAVENOUS | Status: AC
  Filled 2022-01-07: qty 250

## 2022-01-09 ENCOUNTER — Encounter: Payer: Self-pay | Admitting: Oncology

## 2022-01-11 ENCOUNTER — Encounter: Payer: Self-pay | Admitting: Family Medicine

## 2022-01-20 DIAGNOSIS — F902 Attention-deficit hyperactivity disorder, combined type: Secondary | ICD-10-CM | POA: Diagnosis not present

## 2022-01-20 DIAGNOSIS — F331 Major depressive disorder, recurrent, moderate: Secondary | ICD-10-CM | POA: Diagnosis not present

## 2022-02-16 ENCOUNTER — Encounter: Payer: Self-pay | Admitting: Oncology

## 2022-02-16 NOTE — Telephone Encounter (Signed)
Please move up Nov appts and infrom pt of new appt details.   Labs 1-2 prior to md/ venofer.

## 2022-02-25 ENCOUNTER — Other Ambulatory Visit: Payer: Self-pay

## 2022-02-25 DIAGNOSIS — D500 Iron deficiency anemia secondary to blood loss (chronic): Secondary | ICD-10-CM

## 2022-02-25 NOTE — Progress Notes (Unsigned)
[facility]

## 2022-02-26 ENCOUNTER — Inpatient Hospital Stay: Payer: Federal, State, Local not specified - PPO | Attending: Oncology

## 2022-02-26 DIAGNOSIS — D500 Iron deficiency anemia secondary to blood loss (chronic): Secondary | ICD-10-CM

## 2022-02-26 DIAGNOSIS — D509 Iron deficiency anemia, unspecified: Secondary | ICD-10-CM | POA: Insufficient documentation

## 2022-02-26 LAB — CBC WITH DIFFERENTIAL/PLATELET
Abs Immature Granulocytes: 0.01 10*3/uL (ref 0.00–0.07)
Basophils Absolute: 0.1 10*3/uL (ref 0.0–0.1)
Basophils Relative: 1 %
Eosinophils Absolute: 0.2 10*3/uL (ref 0.0–0.5)
Eosinophils Relative: 4 %
HCT: 43.4 % (ref 36.0–46.0)
Hemoglobin: 14.2 g/dL (ref 12.0–15.0)
Immature Granulocytes: 0 %
Lymphocytes Relative: 30 %
Lymphs Abs: 1.9 10*3/uL (ref 0.7–4.0)
MCH: 30.6 pg (ref 26.0–34.0)
MCHC: 32.7 g/dL (ref 30.0–36.0)
MCV: 93.5 fL (ref 80.0–100.0)
Monocytes Absolute: 0.5 10*3/uL (ref 0.1–1.0)
Monocytes Relative: 8 %
Neutro Abs: 3.6 10*3/uL (ref 1.7–7.7)
Neutrophils Relative %: 57 %
Platelets: 253 10*3/uL (ref 150–400)
RBC: 4.64 MIL/uL (ref 3.87–5.11)
RDW: 18.3 % — ABNORMAL HIGH (ref 11.5–15.5)
WBC: 6.3 10*3/uL (ref 4.0–10.5)
nRBC: 0 % (ref 0.0–0.2)

## 2022-02-26 LAB — RETIC PANEL
Immature Retic Fract: 12 % (ref 2.3–15.9)
RBC.: 4.7 MIL/uL (ref 3.87–5.11)
Retic Count, Absolute: 70.5 10*3/uL (ref 19.0–186.0)
Retic Ct Pct: 1.5 % (ref 0.4–3.1)
Reticulocyte Hemoglobin: 35.9 pg (ref >27.9)

## 2022-02-26 LAB — IRON AND TIBC
Iron: 83 ug/dL (ref 28–170)
Saturation Ratios: 25 % (ref 10.4–31.8)
TIBC: 335 ug/dL (ref 250–450)
UIBC: 252 ug/dL

## 2022-02-26 LAB — FERRITIN: Ferritin: 38 ng/mL (ref 11–307)

## 2022-02-26 MED FILL — Iron Sucrose Inj 20 MG/ML (Fe Equiv): INTRAVENOUS | Qty: 10 | Status: AC

## 2022-03-01 ENCOUNTER — Inpatient Hospital Stay: Payer: Federal, State, Local not specified - PPO | Admitting: Oncology

## 2022-03-01 ENCOUNTER — Inpatient Hospital Stay: Payer: Federal, State, Local not specified - PPO

## 2022-03-09 ENCOUNTER — Other Ambulatory Visit: Payer: Federal, State, Local not specified - PPO

## 2022-03-15 ENCOUNTER — Ambulatory Visit: Payer: Federal, State, Local not specified - PPO

## 2022-03-15 ENCOUNTER — Ambulatory Visit: Payer: Federal, State, Local not specified - PPO | Admitting: Oncology

## 2022-03-17 ENCOUNTER — Ambulatory Visit: Payer: Federal, State, Local not specified - PPO | Admitting: Gastroenterology

## 2022-04-14 DIAGNOSIS — E7801 Familial hypercholesterolemia: Secondary | ICD-10-CM | POA: Diagnosis not present

## 2022-04-14 DIAGNOSIS — Z01810 Encounter for preprocedural cardiovascular examination: Secondary | ICD-10-CM | POA: Diagnosis not present

## 2022-04-15 ENCOUNTER — Ambulatory Visit
Admission: RE | Admit: 2022-04-15 | Discharge: 2022-04-15 | Disposition: A | Discharge status: Home / Self Care | Payer: Federal, State, Local not specified - PPO | Attending: Gastroenterology | Admitting: Gastroenterology

## 2022-04-15 ENCOUNTER — Ambulatory Visit: Payer: Federal, State, Local not specified - PPO | Admitting: Certified Registered"

## 2022-04-15 ENCOUNTER — Encounter
Admission: RE | Disposition: A | Discharge status: Home / Self Care | Payer: Self-pay | Source: Home / Self Care | Attending: Gastroenterology

## 2022-04-15 ENCOUNTER — Encounter: Payer: Self-pay | Admitting: Gastroenterology

## 2022-04-15 DIAGNOSIS — K635 Polyp of colon: Secondary | ICD-10-CM | POA: Diagnosis not present

## 2022-04-15 DIAGNOSIS — K315 Obstruction of duodenum: Secondary | ICD-10-CM | POA: Diagnosis not present

## 2022-04-15 DIAGNOSIS — R1310 Dysphagia, unspecified: Secondary | ICD-10-CM | POA: Diagnosis not present

## 2022-04-15 DIAGNOSIS — D509 Iron deficiency anemia, unspecified: Secondary | ICD-10-CM | POA: Diagnosis not present

## 2022-04-15 DIAGNOSIS — B9681 Helicobacter pylori [H. pylori] as the cause of diseases classified elsewhere: Secondary | ICD-10-CM | POA: Diagnosis not present

## 2022-04-15 DIAGNOSIS — K649 Unspecified hemorrhoids: Secondary | ICD-10-CM | POA: Diagnosis not present

## 2022-04-15 DIAGNOSIS — F32A Depression, unspecified: Secondary | ICD-10-CM | POA: Insufficient documentation

## 2022-04-15 DIAGNOSIS — K31A19 Gastric intestinal metaplasia without dysplasia, unspecified site: Secondary | ICD-10-CM | POA: Insufficient documentation

## 2022-04-15 DIAGNOSIS — Z9889 Other specified postprocedural states: Secondary | ICD-10-CM | POA: Diagnosis not present

## 2022-04-15 DIAGNOSIS — K2950 Unspecified chronic gastritis without bleeding: Secondary | ICD-10-CM | POA: Diagnosis not present

## 2022-04-15 DIAGNOSIS — Z86010 Personal history of colonic polyps: Secondary | ICD-10-CM | POA: Diagnosis not present

## 2022-04-15 DIAGNOSIS — Z800 Family history of malignant neoplasm of digestive organs: Secondary | ICD-10-CM | POA: Diagnosis not present

## 2022-04-15 DIAGNOSIS — D759 Disease of blood and blood-forming organs, unspecified: Secondary | ICD-10-CM | POA: Insufficient documentation

## 2022-04-15 DIAGNOSIS — K219 Gastro-esophageal reflux disease without esophagitis: Secondary | ICD-10-CM | POA: Diagnosis not present

## 2022-04-15 DIAGNOSIS — K269 Duodenal ulcer, unspecified as acute or chronic, without hemorrhage or perforation: Secondary | ICD-10-CM | POA: Diagnosis not present

## 2022-04-15 DIAGNOSIS — K640 First degree hemorrhoids: Secondary | ICD-10-CM | POA: Insufficient documentation

## 2022-04-15 DIAGNOSIS — K2980 Duodenitis without bleeding: Secondary | ICD-10-CM | POA: Insufficient documentation

## 2022-04-15 DIAGNOSIS — Z79899 Other long term (current) drug therapy: Secondary | ICD-10-CM | POA: Diagnosis not present

## 2022-04-15 DIAGNOSIS — Z1211 Encounter for screening for malignant neoplasm of colon: Secondary | ICD-10-CM | POA: Insufficient documentation

## 2022-04-15 DIAGNOSIS — K2100 Gastro-esophageal reflux disease with esophagitis, without bleeding: Secondary | ICD-10-CM | POA: Diagnosis not present

## 2022-04-15 DIAGNOSIS — Q438 Other specified congenital malformations of intestine: Secondary | ICD-10-CM | POA: Diagnosis not present

## 2022-04-15 DIAGNOSIS — D126 Benign neoplasm of colon, unspecified: Secondary | ICD-10-CM | POA: Diagnosis not present

## 2022-04-15 DIAGNOSIS — K5730 Diverticulosis of large intestine without perforation or abscess without bleeding: Secondary | ICD-10-CM | POA: Diagnosis not present

## 2022-04-15 DIAGNOSIS — K2970 Gastritis, unspecified, without bleeding: Secondary | ICD-10-CM | POA: Diagnosis not present

## 2022-04-15 HISTORY — PX: ESOPHAGOGASTRODUODENOSCOPY: SHX5428

## 2022-04-15 HISTORY — PX: COLONOSCOPY: SHX5424

## 2022-04-15 HISTORY — DX: Unspecified asthma, uncomplicated: J45.909

## 2022-04-15 LAB — HM COLONOSCOPY

## 2022-04-15 SURGERY — COLONOSCOPY
Anesthesia: General

## 2022-04-15 MED ORDER — DEXMEDETOMIDINE HCL IN NACL 80 MCG/20ML IV SOLN
INTRAVENOUS | Status: DC | PRN
  Administered 2022-04-15: 10 ug via BUCCAL

## 2022-04-15 MED ORDER — LIDOCAINE HCL (CARDIAC) PF 100 MG/5ML IV SOSY
PREFILLED_SYRINGE | INTRAVENOUS | Status: DC | PRN
  Administered 2022-04-15: 80 mg via INTRAVENOUS

## 2022-04-15 MED ORDER — PROPOFOL 10 MG/ML IV BOLUS
INTRAVENOUS | Status: DC | PRN
  Administered 2022-04-15 (×2): 50 mg via INTRAVENOUS
  Administered 2022-04-15: 40 mg via INTRAVENOUS
  Administered 2022-04-15: 50 mg via INTRAVENOUS

## 2022-04-15 MED ORDER — SODIUM CHLORIDE 0.9 % IV SOLN: INTRAVENOUS | Status: DC

## 2022-04-15 MED ORDER — PROPOFOL 1000 MG/100ML IV EMUL
INTRAVENOUS | Status: AC
  Filled 2022-04-15: qty 100

## 2022-04-15 MED ORDER — PROPOFOL 500 MG/50ML IV EMUL
INTRAVENOUS | Status: DC | PRN
  Administered 2022-04-15: 150 ug/kg/min via INTRAVENOUS

## 2022-04-15 NOTE — Transfer of Care (Signed)
Immediate Anesthesia Transfer of Care Note  Patient: Catherine Page  Procedure(s) Performed: COLONOSCOPY ESOPHAGOGASTRODUODENOSCOPY (EGD)  Patient Location: PACU and Endoscopy Unit  Anesthesia Type:General  Level of Consciousness: sedated  Airway & Oxygen Therapy: Patient Spontanous Breathing  Post-op Assessment: Report given to RN and Post -op Vital signs reviewed and stable  Post vital signs: Reviewed and stable  Last Vitals:  Vitals Value Taken Time  BP    Temp    Pulse 85 04/15/22 1105  Resp 19 04/15/22 1105  SpO2 97 % 04/15/22 1105  Vitals shown include unvalidated device data.  Last Pain:  Vitals:   04/15/22 0924  TempSrc: Temporal  PainSc: 0-No pain         Complications: No notable events documented.

## 2022-04-15 NOTE — Op Note (Signed)
[facility] Gastroenterology Patient Name: Catherine Page Procedure Date: 04/15/2022 9:46 AM MRN: [id number] Account #: [id number] Date of Birth: [birth year] Admit Type: Outpatient Age: 61 Room: [facility] ENDO ROOM 1 Gender: Female Note Status: Finalized Instrument Name: Colonoscope [id number] Procedure:             Colonoscopy Indications:           High risk colon cancer surveillance: Personal history                         of colonic polyps Providers:             Annamaria Helling DO, DO Referring MD:          Juline Patch, MD (Referring MD) Medicines:             Monitored Anesthesia Care Complications:         No immediate complications. Estimated blood loss:                         Minimal. Procedure:             Pre-Anesthesia Assessment:                        - Prior to the procedure, a History and Physical was                         performed, and patient medications and allergies were                         reviewed. The patient is competent. The risks and                         benefits of the procedure and the sedation options and                         risks were discussed with the patient. All questions                         were answered and informed consent was obtained.                         Patient identification and proposed procedure were                         verified by the physician, the nurse, the anesthetist                         and the technician in the endoscopy suite. Mental                         Status Examination: alert and oriented. Airway                         Examination: normal oropharyngeal airway and neck                         mobility. Respiratory Examination: clear to  auscultation. CV Examination: RRR, no murmurs, no S3                         or S4. Prophylactic Antibiotics: The patient does not                         require prophylactic antibiotics. Prior                          Anticoagulants: The patient has taken no anticoagulant                         or antiplatelet agents. ASA Grade Assessment: III - A                         patient with severe systemic disease. After reviewing                         the risks and benefits, the patient was deemed in                         satisfactory condition to undergo the procedure. The                         anesthesia plan was to use monitored anesthesia care                         (MAC). Immediately prior to administration of                         medications, the patient was re-assessed for adequacy                         to receive sedatives. The heart rate, respiratory                         rate, oxygen saturations, blood pressure, adequacy of                         pulmonary ventilation, and response to care were                         monitored throughout the procedure. The physical                         status of the patient was re-assessed after the                         procedure.                        After obtaining informed consent, the colonoscope was                         passed under direct vision. Throughout the procedure,                         the patient's blood pressure, pulse, and oxygen  saturations were monitored continuously. The                         Colonoscope was introduced through the anus and                         advanced to the the terminal ileum, with                         identification of the appendiceal orifice and IC                         valve. The colonoscopy was somewhat difficult due to                         multiple diverticula in the colon, a redundant colon                         and the patient's body habitus. Successful completion                         of the procedure was aided by straightening and                         shortening the scope to obtain bowel loop reduction,                         using scope torsion,  applying abdominal pressure and                         lavage. The patient tolerated the procedure well. The                         quality of the bowel preparation was evaluated using                         the BBPS [facility] Bowel Preparation Scale) with scores                         of: Right Colon = 2 (minor amount of residual                         staining, small fragments of stool and/or opaque                         liquid, but mucosa seen well), Transverse Colon = 3                         (entire mucosa seen well with no residual staining,                         small fragments of stool or opaque liquid) and Left                         Colon = 3 (entire mucosa seen well with no residual  staining, small fragments of stool or opaque liquid).                         The total BBPS score equals 8. The quality of the                         bowel preparation was excellent. The terminal ileum,                         ileocecal valve, appendiceal orifice, and rectum were                         photographed. Findings:      The perianal and digital rectal examinations were normal. Pertinent       negatives include normal sphincter tone.      The terminal ileum appeared normal. Estimated blood loss: none.      A 1 to 2 mm polyp was found in the transverse colon. The polyp was       sessile. The polyp was removed with a jumbo cold forceps. Resection and       retrieval were complete. Estimated blood loss was minimal.      Multiple small-mouthed diverticula were found in the left colon.       Estimated blood loss: none.      Non-bleeding internal hemorrhoids were found during retroflexion. The       hemorrhoids were Grade I (internal hemorrhoids that do not prolapse).       Estimated blood loss: none.      The exam was otherwise without abnormality on direct and retroflexion       views. Impression:            - The examined portion of the ileum was normal.                         - One 1 to 2 mm polyp in the transverse colon, removed                         with a jumbo cold forceps. Resected and retrieved.                        - Diverticulosis in the left colon.                        - Non-bleeding internal hemorrhoids.                        - The examination was otherwise normal on direct and                         retroflexion views. Recommendation:        - Patient has a contact number available for                         emergencies. The signs and symptoms of potential                         delayed complications were discussed with the patient.  Return to normal activities tomorrow. Written                         discharge instructions were provided to the patient.                        - Discharge patient to home.                        - Resume previous diet.                        - Continue present medications.                        - Await pathology results.                        - Repeat colonoscopy for surveillance based on                         pathology results.                        - Return to GI office as previously scheduled.                        - The findings and recommendations were discussed with                         the patient. Procedure Code(s):     --- Professional ---                        [phone], Colonoscopy, flexible; with biopsy, single or                         multiple Diagnosis Code(s):     --- Professional ---                        Z86.010, Personal history of colonic polyps                        K64.0, First degree hemorrhoids                        D12.3, Benign neoplasm of transverse colon (hepatic                         flexure or splenic flexure)                        K57.30, Diverticulosis of large intestine without                         perforation or abscess without bleeding CPT copyright 2022 American Medical Association. All rights reserved. The  codes documented in this report are preliminary and upon coder review may  be revised to meet current compliance requirements. Attending Participation:      I personally performed the entire procedure. Volney American, DO Annamaria Helling DO, DO 04/15/2022 11:02:44 AM This report has been signed electronically. Number of Addenda: 0  Note Initiated On: 04/15/2022 9:46 AM Scope Withdrawal Time: 0 hours 12 minutes 26 seconds  Total Procedure Duration: 0 hours 26 minutes 12 seconds  Estimated Blood Loss:  Estimated blood loss was minimal.      [facility]

## 2022-04-15 NOTE — Anesthesia Postprocedure Evaluation (Signed)
Anesthesia Post Note  Patient: Norah Devin  Procedure(s) Performed: COLONOSCOPY ESOPHAGOGASTRODUODENOSCOPY (EGD)  Patient location during evaluation: PACU Anesthesia Type: General Level of consciousness: awake and alert Pain management: pain level controlled Vital Signs Assessment: post-procedure vital signs reviewed and stable Respiratory status: spontaneous breathing, nonlabored ventilation, respiratory function stable and patient connected to nasal cannula oxygen Cardiovascular status: blood pressure returned to baseline and stable Postop Assessment: no apparent nausea or vomiting Anesthetic complications: no   No notable events documented.   Last Vitals:  Vitals:   04/15/22 0924 04/15/22 1104  BP: (!) 147/92 124/86  Pulse: 76 80  Resp: 16 19  Temp: (!) 36.4 C (!) 36.1 C  SpO2: 100% 100%    Last Pain:  Vitals:   04/15/22 1104  TempSrc: Temporal  PainSc: 0-No pain                 Yevette Edwards

## 2022-04-15 NOTE — Op Note (Signed)
[facility] Gastroenterology Patient Name: Catherine Page Procedure Date: 04/15/2022 9:47 AM MRN: [id number] Account #: [id number] Date of Birth: 26-May-1960 Admit Type: Outpatient Age: 61 Room: [facility] ENDO ROOM 1 Gender: Female Note Status: Finalized Instrument Name: Upper Endoscope [id number] Procedure:             Upper GI endoscopy Indications:           Iron deficiency anemia, Dysphagia Providers:             Annamaria Helling DO, DO Referring MD:          Juline Patch, MD (Referring MD) Medicines:             Monitored Anesthesia Care Complications:         No immediate complications. Estimated blood loss:                         Minimal. Procedure:             Pre-Anesthesia Assessment:                        - Prior to the procedure, a History and Physical was                         performed, and patient medications and allergies were                         reviewed. The patient is competent. The risks and                         benefits of the procedure and the sedation options and                         risks were discussed with the patient. All questions                         were answered and informed consent was obtained.                         Patient identification and proposed procedure were                         verified by the physician, the nurse, the anesthetist                         and the technician in the endoscopy suite. Mental                         Status Examination: alert and oriented. Airway                         Examination: normal oropharyngeal airway and neck                         mobility. Respiratory Examination: clear to                         auscultation. CV Examination: RRR, no murmurs, no S3  or S4. Prophylactic Antibiotics: The patient does not                         require prophylactic antibiotics. Prior                         Anticoagulants: The patient has taken no anticoagulant                          or antiplatelet agents. ASA Grade Assessment: III - A                         patient with severe systemic disease. After reviewing                         the risks and benefits, the patient was deemed in                         satisfactory condition to undergo the procedure. The                         anesthesia plan was to use monitored anesthesia care                         (MAC). Immediately prior to administration of                         medications, the patient was re-assessed for adequacy                         to receive sedatives. The heart rate, respiratory                         rate, oxygen saturations, blood pressure, adequacy of                         pulmonary ventilation, and response to care were                         monitored throughout the procedure. The physical                         status of the patient was re-assessed after the                         procedure.                        After obtaining informed consent, the endoscope was                         passed under direct vision. Throughout the procedure,                         the patient's blood pressure, pulse, and oxygen                         saturations were monitored continuously. The Endoscope  was introduced through the mouth, and advanced to the                         second part of duodenum. The upper GI endoscopy was                         accomplished without difficulty. The patient tolerated                         the procedure well. Findings:      One non-bleeding cratered duodenal ulcer with a clean ulcer base       (Forrest Class III) was found in the duodenal bulb. The lesion was 3 mm       in largest dimension. Biopsies were taken with a cold forceps for       histology. Estimated blood loss was minimal.      An acquired benign-appearing, intrinsic moderate stenosis was found in       the first portion of the duodenum and  was traversed. Biopsies were taken       with a cold forceps for histology. Estimated blood loss was minimal.      Localized moderate inflammation characterized by erosions and erythema       was found in the gastric antrum. Biopsies were taken with a cold forceps       for Helicobacter pylori testing. Antral and Body biopsies in separate       jars. Estimated blood loss was minimal.      Evidence of a prior Nissen fundoplication was found in the       gastroesophageal junction. This was characterized by healthy appearing       mucosa. Estimated blood loss: none.      The exam of the stomach was otherwise normal.      The Z-line was variable. Biopsies were taken with a cold forceps for       histology. Estimated blood loss was minimal.      Normal mucosa was found in the entire esophagus. Biopsies were taken       with a cold forceps for histology. from mid esophagus; rule out eoe The       scope was withdrawn. Dilation was performed with a Maloney dilator with       moderate resistance at 56 Fr; dilator not passed further once resistance       met. The dilation site was examined following endoscope reinsertion and       showed no change. Estimated blood loss was minimal.      LA Grade A (one or more mucosal breaks less than 5 mm, not extending       between tops of 2 mucosal folds) esophagitis with no bleeding was found.       Estimated blood loss: none.      Pt had fair amount of oozing from biopsy sites. This had stopped at time       of end of exam and was monitored while in the lumen. Impression:            - Non-bleeding duodenal ulcer with a clean ulcer base                         (Forrest Class III). Biopsied.                        -  Acquired duodenal stenosis. Biopsied.                        - Gastritis. Biopsied.                        - A Nissen fundoplication was found, characterized by                         healthy appearing mucosa.                        - Z-line  variable. Biopsied.                        - Normal mucosa was found in the entire esophagus.                         Biopsied. Dilated.                        - LA Grade A esophagitis with no bleeding. Recommendation:        - Patient has a contact number available for                         emergencies. The signs and symptoms of potential                         delayed complications were discussed with the patient.                         Return to normal activities tomorrow. Written                         discharge instructions were provided to the patient.                        - Discharge patient to home.                        - Soft diet, moisten and chew food well.                        - Continue present medications.                        - No aspirin, ibuprofen, naproxen, or other                         non-steroidal anti-inflammatory drugs.                        - Await pathology results.                        - Repeat upper endoscopy 8-12 weeks to evaluate the                         response to therapy.                        - Use Prilosec (omeprazole)  40 mg PO BID for 12 weeks.                        - Return to GI clinic as previously scheduled.                        - The findings and recommendations were discussed with                         the patient. Procedure Code(s):     --- Professional ---                        [phone], Esophagogastroduodenoscopy, flexible,                         transoral; with biopsy, single or multiple                        43450, Dilation of esophagus, by unguided sound or                         bougie, single or multiple passes Diagnosis Code(s):     --- Professional ---                        K26.9, Duodenal ulcer, unspecified as acute or                         chronic, without hemorrhage or perforation                        K31.5, Obstruction of duodenum                        K29.70, Gastritis, unspecified, without  bleeding                        Z98.890, Other specified postprocedural states                        K22.89, Other specified disease of esophagus                        K20.90, Esophagitis, unspecified without bleeding                        D50.9, Iron deficiency anemia, unspecified                        R13.10, Dysphagia, unspecified CPT copyright 2022 American [employer] Association. All rights reserved. The codes documented in this report are preliminary and upon coder review may  be revised to meet current compliance requirements. Attending Participation:      I personally performed the entire procedure. Volney American, DO Annamaria Helling DO, DO 04/15/2022 11:10:45 AM This report has been signed electronically. Number of Addenda: 0 Note Initiated On: 04/15/2022 9:47 AM Estimated Blood Loss:  Estimated blood loss was minimal.      [facility]

## 2022-04-15 NOTE — Interval H&P Note (Signed)
History and Physical Interval Note: Preprocedure H&P from 04/15/22  was reviewed and there was no interval change after seeing and examining the patient.  Written consent was obtained from the patient after discussion of risks, benefits, and alternatives. Patient has consented to proceed with Esophagogastroduodenoscopy and Colonoscopy with possible intervention   04/15/2022 10:02 AM  Claudina Lick  has presented today for surgery, with the diagnosis of Iron deficiency anemia, unspecified iron deficiency anemia type (D50.9) Gastroesophageal reflux disease, unspecified whether esophagitis present (K21.9) History of adenomatous polyp of colon (Z86.010).  The various methods of treatment have been discussed with the patient and family. After consideration of risks, benefits and other options for treatment, the patient has consented to  Procedure(s): COLONOSCOPY (N/A) ESOPHAGOGASTRODUODENOSCOPY (EGD) (N/A) as a surgical intervention.  The patient's history has been reviewed, patient examined, no change in status, stable for surgery.  I have reviewed the patient's chart and labs.  Questions were answered to the patient's satisfaction.     Jaynie Collins

## 2022-04-15 NOTE — H&P (Signed)
Pre-Procedure H&P   Patient ID: Catherine Page is a 61 y.o. female.  Gastroenterology Provider: Jaynie Collins, DO  Referring Provider: Fransico Setters, NP  PCP: Duanne Limerick, MD  Date: 04/15/2022  HPI Ms. Catherine Page is a 61 y.o. female who presents today for Esophagogastroduodenoscopy and Colonoscopy for Iron deficiency anemia, dysphagia, phx colon polyps .  Patient seen in the office in July noted to have iron deficiency anemia.  Ferritin measured at 6 hemoglobin decreased from 11 down to 7.3.  She began iron supplementation and levels have improved her saturation 25% ferritin 38 hemoglobin 14.2 MCV 93.5 platelets 235,000.  No melena or hematochezia.  No abnormal weight loss.  Patient had ongoing reflux and dysphagia which is improved with Protonix.  She occasionally still has some dysphagia symptoms but no odynophagia.  She does frequently take Goody powders but has since stopped since her office appointment.  History of Nissen fundoplication in 1995  Previous colonoscopies in 2014 and 2009 demonstrated adenomatous polyps.  Brother with esophageal cancer  Past Medical History:  Diagnosis Date   Asthma    Depression    Insomnia     Past Surgical History:  Procedure Laterality Date   COLONOSCOPY     ESOPHAGOGASTRODUODENOSCOPY     FOOT SURGERY     NISSEN FUNDOPLICATION N/A 1995   SHOULDER SURGERY Left 05/03/2018    Family History Esophageal cancer-Brother No other h/o GI disease or malignancy  Review of Systems  Constitutional:  Negative for activity change, appetite change, chills, diaphoresis, fatigue, fever and unexpected weight change.  HENT:  Positive for trouble swallowing. Negative for voice change.   Respiratory:  Negative for shortness of breath and wheezing.   Cardiovascular:  Negative for chest pain, palpitations and leg swelling.  Gastrointestinal:  Negative for abdominal distention, abdominal pain, anal bleeding, blood in stool, constipation,  diarrhea, nausea, rectal pain and vomiting.  Musculoskeletal:  Negative for arthralgias and myalgias.  Skin:  Negative for color change and pallor.  Neurological:  Negative for dizziness, syncope and weakness.  Psychiatric/Behavioral:  Negative for confusion.   All other systems reviewed and are negative.    Medications No current facility-administered medications on file prior to encounter.   Current Outpatient Medications on File Prior to Encounter  Medication Sig Dispense Refill   amphetamine-dextroamphetamine (ADDERALL) 20 MG tablet Take 20 mg by mouth 2 (two) times daily. CBC     ARIPiprazole (ABILIFY) 2 MG tablet Take 2 mg by mouth daily. CBC     desvenlafaxine (PRISTIQ) 100 MG 24 hr tablet Take 100 mg by mouth daily. CBC     Ferrous Sulfate (IRON) 142 (45 Fe) MG TBCR Take 1 tablet by mouth daily. 30 tablet 1   pantoprazole (PROTONIX) 40 MG tablet Take 1 tablet by mouth 2 (two) times daily.     VYVANSE 70 MG capsule Take 70 mg by mouth every morning.     ALPRAZolam (XANAX) 1 MG tablet Take 1 mg by mouth 2 (two) times daily as needed.     alprazolam (XANAX) 2 MG tablet Take 2 mg by mouth at bedtime as needed for sleep. CBC     amphetamine-dextroamphetamine (ADDERALL) 20 MG tablet Take 1 tablet by mouth 3 (three) times daily.     pantoprazole (PROTONIX) 40 MG tablet TAKE 1 TABLET(40 MG) BY MOUTH DAILY 90 tablet 0   sucralfate (CARAFATE) 1 g tablet Take 1 tablet (1 g total) by mouth 4 (four) times daily. 120 tablet 0  zolpidem (AMBIEN) 10 MG tablet Take 12.5 mg by mouth at bedtime as needed for sleep. CBC      Pertinent medications related to GI and procedure were reviewed by me with the patient prior to the procedure   Current Facility-Administered Medications:    0.9 %  sodium chloride infusion, , Intravenous, Continuous, Jaynie Collins, DO, Last Rate: 20 mL/hr at 04/15/22 0947, Continued from Pre-op at 04/15/22 0947  sodium chloride 20 mL/hr at 04/15/22 5681        Allergies  Allergen Reactions   No Known Allergies    Other    Allergies were reviewed by me prior to the procedure  Objective   Body mass index is 42.57 kg/m. Vitals:   04/15/22 0924  BP: (!) 147/92  Pulse: 76  Resp: 16  Temp: (!) 97.5 F (36.4 C)  TempSrc: Temporal  SpO2: 100%  Weight: 116 kg  Height: 5\' 5"  (1.651 m)     Physical Exam Vitals and nursing note reviewed.  Constitutional:      General: She is not in acute distress.    Appearance: Normal appearance. She is obese. She is not ill-appearing, toxic-appearing or diaphoretic.  HENT:     Head: Normocephalic and atraumatic.     Nose: Nose normal.     Mouth/Throat:     Mouth: Mucous membranes are moist.     Pharynx: Oropharynx is clear.  Eyes:     General: No scleral icterus.    Extraocular Movements: Extraocular movements intact.  Cardiovascular:     Rate and Rhythm: Normal rate and regular rhythm.     Heart sounds: Normal heart sounds. No murmur heard.    No friction rub. No gallop.  Pulmonary:     Effort: Pulmonary effort is normal. No respiratory distress.     Breath sounds: Normal breath sounds. No wheezing, rhonchi or rales.  Abdominal:     General: Bowel sounds are normal. There is no distension.     Palpations: Abdomen is soft.     Tenderness: There is no abdominal tenderness. There is no guarding or rebound.  Musculoskeletal:     Cervical back: Neck supple.     Right lower leg: No edema.     Left lower leg: No edema.  Skin:    General: Skin is warm and dry.     Coloration: Skin is not jaundiced or pale.  Neurological:     General: No focal deficit present.     Mental Status: She is alert and oriented to person, place, and time. Mental status is at baseline.  Psychiatric:        Mood and Affect: Mood normal.        Behavior: Behavior normal.        Thought Content: Thought content normal.        Judgment: Judgment normal.      Assessment:  Ms. Catherine Page is a 61 y.o. female  who  presents today for Esophagogastroduodenoscopy and Colonoscopy for Iron deficiency anemia, dysphagia, phx colon polyps .  Plan:  Esophagogastroduodenoscopy and Colonoscopy with possible intervention today  Esophagogastroduodenoscopy and Colonoscopy with possible biopsy, control of bleeding, polypectomy, and interventions as necessary has been discussed with the patient/patient representative. Informed consent was obtained from the patient/patient representative after explaining the indication, nature, and risks of the procedure including but not limited to death, bleeding, perforation, missed neoplasm/lesions, cardiorespiratory compromise, and reaction to medications. Opportunity for questions was given and appropriate answers were provided. Patient/patient representative  has verbalized understanding is amenable to undergoing the procedure.   Jaynie Collins, DO  [facility] Gastroenterology  Portions of the record may have been created with voice recognition software. Occasional wrong-word or 'sound-a-like' substitutions may have occurred due to the inherent limitations of voice recognition software.  Read the chart carefully and recognize, using context, where substitutions may have occurred.

## 2022-04-15 NOTE — Anesthesia Preprocedure Evaluation (Signed)
Anesthesia Evaluation  Patient identified by MRN, date of birth, ID band Patient awake    Reviewed: Allergy & Precautions, H&P , NPO status , Patient's Chart, lab work & pertinent test results, reviewed documented beta blocker date and time   Airway Mallampati: II   Neck ROM: full    Dental  (+) Poor Dentition   Pulmonary asthma    Pulmonary exam normal        Cardiovascular Exercise Tolerance: Good negative cardio ROS Normal cardiovascular exam Rhythm:regular Rate:Normal     Neuro/Psych  PSYCHIATRIC DISORDERS  Depression    negative neurological ROS     GI/Hepatic negative GI ROS, Neg liver ROS,,,  Endo/Other  negative endocrine ROS    Renal/GU negative Renal ROS  negative genitourinary   Musculoskeletal   Abdominal   Peds  Hematology  (+) Blood dyscrasia, anemia   Anesthesia Other Findings Past Medical History: No date: Asthma No date: Depression No date: Insomnia Past Surgical History: No date: COLONOSCOPY No date: ESOPHAGOGASTRODUODENOSCOPY No date: FOOT SURGERY 1995: NISSEN FUNDOPLICATION; N/A 05/03/2018: SHOULDER SURGERY; Left BMI    Body Mass Index: 42.57 kg/m     Reproductive/Obstetrics negative OB ROS                             Anesthesia Physical Anesthesia Plan  ASA: 3  Anesthesia Plan: General   Post-op Pain Management:    Induction:   PONV Risk Score and Plan:   Airway Management Planned:   Additional Equipment:   Intra-op Plan:   Post-operative Plan:   Informed Consent: I have reviewed the patients History and Physical, chart, labs and discussed the procedure including the risks, benefits and alternatives for the proposed anesthesia with the patient or authorized representative who has indicated his/her understanding and acceptance.     Dental Advisory Given  Plan Discussed with: CRNA  Anesthesia Plan Comments:        Anesthesia Quick  Evaluation

## 2022-04-16 ENCOUNTER — Encounter: Payer: Self-pay | Admitting: Gastroenterology

## 2022-04-16 ENCOUNTER — Telehealth: Payer: Self-pay

## 2022-04-16 DIAGNOSIS — D500 Iron deficiency anemia secondary to blood loss (chronic): Secondary | ICD-10-CM

## 2022-04-16 NOTE — Telephone Encounter (Signed)
Pt no showed to past few visits but we received referral again from GI.   Please schedule and inform pt :   labs- next available  MD/ venofer  a few days after labs

## 2022-04-16 NOTE — Telephone Encounter (Signed)
-----   Message from Rickard Patience, MD sent at 04/16/2022  2:13 PM EST ----- Regarding: RE: REFERRAL- [facility] Please schedule her to get labs - cbc iron tibc ferritin, a few days prior to MD + Venofer. Next available.    ----- Message ----- From: Coralee Rud, RN Sent: 04/16/2022   2:10 PM EST To: Coralee Rud, RN; Rickard Patience, MD Subject: RE: [facility]ell Noe [facility]                       Pt requested to move up appt but no showed x2. ----- Message ----- From: Reggy Eye Sent: 04/16/2022   1:56 PM EST To: Ronni Rumble; Coralee Rud, RN; # Subject: RE: REFERRAL- [facility]                       Sorry that was me! Robin ----- Message ----- From: Rickard Patience, MD Sent: 04/16/2022   1:45 PM EST To: Ronni Rumble; Coralee Rud, RN; # Subject: RE: REFERRAL- [facility]                       Who is the patient Nehemiah Settle?   ----- Message ----- From: Reggy Eye Sent: 04/16/2022   1:13 PM EST To: Ronni Rumble; Coralee Rud, RN; # Subject: REFERRAL- [facility]                           REFERRAL- [facility] sent to her chart. This patient is an established patient of DR Yu's. She was a new patient on 12/03/2021. She did not return when she was supposed to 3 months later. She is being referred for Hemostatsis/Thrombosis. Dr Cathie Hoops please advise. Notes in chart.

## 2022-04-19 ENCOUNTER — Encounter: Payer: Self-pay | Admitting: Oncology

## 2022-04-19 LAB — SURGICAL PATHOLOGY

## 2022-04-19 NOTE — Telephone Encounter (Signed)
Please cancel appts in Feb and schedule MD only in the next week or 2. Please inform pt of appt.

## 2022-04-28 ENCOUNTER — Inpatient Hospital Stay: Payer: Federal, State, Local not specified - PPO | Attending: Oncology | Admitting: Oncology

## 2022-06-21 ENCOUNTER — Other Ambulatory Visit: Payer: Federal, State, Local not specified - PPO

## 2022-06-22 ENCOUNTER — Ambulatory Visit: Payer: Federal, State, Local not specified - PPO | Admitting: Oncology

## 2022-06-22 ENCOUNTER — Ambulatory Visit: Payer: Federal, State, Local not specified - PPO

## 2022-08-06 DIAGNOSIS — F900 Attention-deficit hyperactivity disorder, predominantly inattentive type: Secondary | ICD-10-CM | POA: Diagnosis not present

## 2022-08-06 DIAGNOSIS — F332 Major depressive disorder, recurrent severe without psychotic features: Secondary | ICD-10-CM | POA: Diagnosis not present

## 2022-08-06 DIAGNOSIS — F410 Panic disorder [episodic paroxysmal anxiety] without agoraphobia: Secondary | ICD-10-CM | POA: Diagnosis not present

## 2022-10-18 ENCOUNTER — Inpatient Hospital Stay
Admission: EM | Admit: 2022-10-18 | Discharge: 2022-10-21 | DRG: 378 | Disposition: A | Discharge status: Home / Self Care | Payer: Federal, State, Local not specified - PPO | Attending: Student in an Organized Health Care Education/Training Program | Admitting: Student in an Organized Health Care Education/Training Program

## 2022-10-18 ENCOUNTER — Other Ambulatory Visit: Payer: Self-pay

## 2022-10-18 DIAGNOSIS — D649 Anemia, unspecified: Secondary | ICD-10-CM

## 2022-10-18 DIAGNOSIS — Z823 Family history of stroke: Secondary | ICD-10-CM | POA: Diagnosis not present

## 2022-10-18 DIAGNOSIS — E538 Deficiency of other specified B group vitamins: Secondary | ICD-10-CM | POA: Diagnosis not present

## 2022-10-18 DIAGNOSIS — F32A Depression, unspecified: Secondary | ICD-10-CM | POA: Diagnosis not present

## 2022-10-18 DIAGNOSIS — Z8249 Family history of ischemic heart disease and other diseases of the circulatory system: Secondary | ICD-10-CM | POA: Diagnosis not present

## 2022-10-18 DIAGNOSIS — K2951 Unspecified chronic gastritis with bleeding: Secondary | ICD-10-CM | POA: Diagnosis not present

## 2022-10-18 DIAGNOSIS — K922 Gastrointestinal hemorrhage, unspecified: Secondary | ICD-10-CM

## 2022-10-18 DIAGNOSIS — D509 Iron deficiency anemia, unspecified: Secondary | ICD-10-CM | POA: Diagnosis present

## 2022-10-18 DIAGNOSIS — K2100 Gastro-esophageal reflux disease with esophagitis, without bleeding: Secondary | ICD-10-CM | POA: Diagnosis not present

## 2022-10-18 DIAGNOSIS — Z6841 Body Mass Index (BMI) 40.0 and over, adult: Secondary | ICD-10-CM | POA: Diagnosis not present

## 2022-10-18 DIAGNOSIS — D62 Acute posthemorrhagic anemia: Secondary | ICD-10-CM | POA: Diagnosis not present

## 2022-10-18 DIAGNOSIS — F419 Anxiety disorder, unspecified: Secondary | ICD-10-CM | POA: Diagnosis present

## 2022-10-18 DIAGNOSIS — E559 Vitamin D deficiency, unspecified: Secondary | ICD-10-CM | POA: Diagnosis present

## 2022-10-18 DIAGNOSIS — Z833 Family history of diabetes mellitus: Secondary | ICD-10-CM | POA: Diagnosis not present

## 2022-10-18 DIAGNOSIS — K2961 Other gastritis with bleeding: Secondary | ICD-10-CM | POA: Diagnosis not present

## 2022-10-18 DIAGNOSIS — K921 Melena: Principal | ICD-10-CM

## 2022-10-18 DIAGNOSIS — G4700 Insomnia, unspecified: Secondary | ICD-10-CM | POA: Diagnosis not present

## 2022-10-18 DIAGNOSIS — A048 Other specified bacterial intestinal infections: Secondary | ICD-10-CM | POA: Diagnosis not present

## 2022-10-18 DIAGNOSIS — K3189 Other diseases of stomach and duodenum: Secondary | ICD-10-CM | POA: Diagnosis present

## 2022-10-18 DIAGNOSIS — D500 Iron deficiency anemia secondary to blood loss (chronic): Secondary | ICD-10-CM | POA: Diagnosis not present

## 2022-10-18 DIAGNOSIS — R195 Other fecal abnormalities: Secondary | ICD-10-CM | POA: Diagnosis not present

## 2022-10-18 DIAGNOSIS — Z8719 Personal history of other diseases of the digestive system: Secondary | ICD-10-CM | POA: Diagnosis not present

## 2022-10-18 DIAGNOSIS — Z820 Family history of epilepsy and other diseases of the nervous system: Secondary | ICD-10-CM | POA: Diagnosis not present

## 2022-10-18 DIAGNOSIS — Z79899 Other long term (current) drug therapy: Secondary | ICD-10-CM

## 2022-10-18 DIAGNOSIS — E785 Hyperlipidemia, unspecified: Secondary | ICD-10-CM | POA: Diagnosis present

## 2022-10-18 LAB — COMPREHENSIVE METABOLIC PANEL
ALT: 29 U/L (ref 0–44)
AST: 40 U/L (ref 15–41)
Albumin: 3.7 g/dL (ref 3.5–5.0)
Alkaline Phosphatase: 69 U/L (ref 38–126)
Anion gap: 10 (ref 5–15)
BUN: 14 mg/dL (ref 6–20)
CO2: 24 mmol/L (ref 22–32)
Calcium: 8.6 mg/dL — ABNORMAL LOW (ref 8.9–10.3)
Chloride: 101 mmol/L (ref 98–111)
Creatinine, Ser: 0.76 mg/dL (ref 0.44–1.00)
GFR, Estimated: >60 mL/min (ref >60)
Glucose, Bld: 98 mg/dL (ref 70–99)
Potassium: 3.5 mmol/L (ref 3.5–5.1)
Sodium: 135 mmol/L (ref 135–145)
Total Bilirubin: 0.5 mg/dL (ref 0.3–1.2)
Total Protein: 7.1 g/dL (ref 6.5–8.1)

## 2022-10-18 LAB — FOLATE: Folate: 4.6 ng/mL — ABNORMAL LOW (ref >5.9)

## 2022-10-18 LAB — CBC
HCT: 26.6 % — ABNORMAL LOW (ref 36.0–46.0)
Hemoglobin: 8.3 g/dL — ABNORMAL LOW (ref 12.0–15.0)
MCH: 32.5 pg (ref 26.0–34.0)
MCHC: 31.2 g/dL (ref 30.0–36.0)
MCV: 104.3 fL — ABNORMAL HIGH (ref 80.0–100.0)
Platelets: 320 10*3/uL (ref 150–400)
RBC: 2.55 MIL/uL — ABNORMAL LOW (ref 3.87–5.11)
RDW: 14.8 % (ref 11.5–15.5)
WBC: 8.6 10*3/uL (ref 4.0–10.5)
nRBC: 0.6 % — ABNORMAL HIGH (ref 0.0–0.2)

## 2022-10-18 LAB — IRON AND TIBC
Iron: 17 ug/dL — ABNORMAL LOW (ref 28–170)
Saturation Ratios: 4 % — ABNORMAL LOW (ref 10.4–31.8)
TIBC: 427 ug/dL (ref 250–450)
UIBC: 410 ug/dL

## 2022-10-18 MED ORDER — ACETAMINOPHEN 650 MG RE SUPP: 650.0000 mg | Freq: Four times a day (QID) | RECTAL | Status: DC | PRN

## 2022-10-18 MED ORDER — ONDANSETRON HCL 4 MG PO TABS: 4.0000 mg | ORAL_TABLET | Freq: Four times a day (QID) | ORAL | Status: DC | PRN

## 2022-10-18 MED ORDER — VENLAFAXINE HCL ER 75 MG PO CP24
150.0000 mg | ORAL_CAPSULE | Freq: Every day | ORAL | Status: DC
  Administered 2022-10-21: 150 mg via ORAL
  Filled 2022-10-18 (×2): qty 2

## 2022-10-18 MED ORDER — AMPHETAMINE-DEXTROAMPHETAMINE 10 MG PO TABS
20.0000 mg | ORAL_TABLET | Freq: Two times a day (BID) | ORAL | Status: DC
  Administered 2022-10-20 – 2022-10-21 (×3): 20 mg via ORAL
  Filled 2022-10-18 (×3): qty 2

## 2022-10-18 MED ORDER — ACETAMINOPHEN 325 MG PO TABS: 650.0000 mg | ORAL_TABLET | Freq: Four times a day (QID) | ORAL | Status: DC | PRN

## 2022-10-18 MED ORDER — SODIUM CHLORIDE 0.9% FLUSH
3.0000 mL | Freq: Two times a day (BID) | INTRAVENOUS | Status: DC
  Administered 2022-10-18 – 2022-10-21 (×4): 3 mL via INTRAVENOUS

## 2022-10-18 MED ORDER — SODIUM CHLORIDE 0.9% FLUSH
3.0000 mL | Freq: Two times a day (BID) | INTRAVENOUS | Status: DC
  Administered 2022-10-18 – 2022-10-20 (×2): 3 mL via INTRAVENOUS

## 2022-10-18 MED ORDER — SODIUM CHLORIDE 0.9 % IV SOLN
200.0000 mg | INTRAVENOUS | Status: DC
  Administered 2022-10-18 – 2022-10-20 (×3): 200 mg via INTRAVENOUS
  Filled 2022-10-18 (×2): qty 200
  Filled 2022-10-18: qty 10
  Filled 2022-10-18: qty 200
  Filled 2022-10-18: qty 10

## 2022-10-18 MED ORDER — BUSPIRONE HCL 10 MG PO TABS
15.0000 mg | ORAL_TABLET | Freq: Two times a day (BID) | ORAL | Status: DC
  Administered 2022-10-18 – 2022-10-21 (×6): 15 mg via ORAL
  Filled 2022-10-18 (×6): qty 2

## 2022-10-18 MED ORDER — POLYSACCHARIDE IRON COMPLEX 150 MG PO CAPS: 150.0000 mg | ORAL_CAPSULE | Freq: Every day | ORAL | Status: DC

## 2022-10-18 MED ORDER — ONDANSETRON HCL 4 MG/2ML IJ SOLN: 4.0000 mg | Freq: Four times a day (QID) | INTRAMUSCULAR | Status: DC | PRN

## 2022-10-18 MED ORDER — ARIPIPRAZOLE 2 MG PO TABS
2.0000 mg | ORAL_TABLET | Freq: Every day | ORAL | Status: DC
  Administered 2022-10-18 – 2022-10-21 (×4): 2 mg via ORAL
  Filled 2022-10-18 (×4): qty 1

## 2022-10-18 MED ORDER — POLYETHYLENE GLYCOL 3350 17 G PO PACK: 17.0000 g | PACK | Freq: Every day | ORAL | Status: DC | PRN

## 2022-10-18 MED ORDER — SODIUM CHLORIDE 0.9% FLUSH
3.0000 mL | INTRAVENOUS | Status: DC | PRN
  Administered 2022-10-19: 3 mL via INTRAVENOUS

## 2022-10-18 MED ORDER — HYDRALAZINE HCL 20 MG/ML IJ SOLN: 10.0000 mg | Freq: Four times a day (QID) | INTRAMUSCULAR | Status: DC | PRN

## 2022-10-18 MED ORDER — SODIUM CHLORIDE 0.9 % IV SOLN: 250.0000 mL | INTRAVENOUS | Status: DC | PRN

## 2022-10-18 MED ORDER — PANTOPRAZOLE SODIUM 40 MG IV SOLR
40.0000 mg | Freq: Two times a day (BID) | INTRAVENOUS | Status: DC
  Administered 2022-10-18 – 2022-10-21 (×6): 40 mg via INTRAVENOUS
  Filled 2022-10-18 (×6): qty 10

## 2022-10-18 MED ORDER — SODIUM CHLORIDE 0.9 % IV SOLN
INTRAVENOUS | Status: AC
  Administered 2022-10-19: 1000 mL via INTRAVENOUS

## 2022-10-18 MED ORDER — ALPRAZOLAM 1 MG PO TABS
1.0000 mg | ORAL_TABLET | Freq: Two times a day (BID) | ORAL | Status: DC | PRN
  Administered 2022-10-18 – 2022-10-20 (×2): 1 mg via ORAL
  Filled 2022-10-18 (×2): qty 1

## 2022-10-18 MED ORDER — ZOLPIDEM TARTRATE 5 MG PO TABS
5.0000 mg | ORAL_TABLET | Freq: Every evening | ORAL | Status: DC | PRN
  Administered 2022-10-19 – 2022-10-20 (×2): 5 mg via ORAL
  Filled 2022-10-18 (×2): qty 1

## 2022-10-18 MED ORDER — FOLIC ACID 1 MG PO TABS
1.0000 mg | ORAL_TABLET | Freq: Every day | ORAL | Status: DC
  Administered 2022-10-18 – 2022-10-21 (×4): 1 mg via ORAL
  Filled 2022-10-18 (×4): qty 1

## 2022-10-18 MED ORDER — PANTOPRAZOLE SODIUM 40 MG IV SOLR
40.0000 mg | Freq: Once | INTRAVENOUS | Status: AC
  Administered 2022-10-18: 40 mg via INTRAVENOUS
  Filled 2022-10-18: qty 10

## 2022-10-18 NOTE — ED Notes (Signed)
Provider Lucianne Muss to bedside. Pt denies any needs currently.

## 2022-10-18 NOTE — ED Notes (Signed)
Pt reports black stool since Thursday; dec appetite; mild nausea; iron dec hx. Skin dry, resp reg/unlabored and sitting calmly on stretcher.

## 2022-10-18 NOTE — ED Triage Notes (Signed)
Pt sts that she has been having blood in her stool. Pt sts that she was over at [facility] and they advised her to come and be seen in the ED since her Hgb is 8.

## 2022-10-18 NOTE — H&P (Addendum)
[facility] History and Physical   Patient: Catherine Page ZOX:[id number]   PCP: Duanne Limerick, MD DOB: December 31, 1960   DOA: 10/18/2022   DOS: 10/18/2022   DOS: the patient was seen and examined on 10/18/2022  Patient coming from: The patient is coming from Home  Chief Complaint: Dark stools, lightheadedness and dizzy, symptomatic anemia  HPI: Catherine Page is a 62 y.o. female with Past medical history of duodenal ulcer s/p H. pylori treatment, HLD, depression, ADD, insomnia, iron deficiency anemia I reviewed from EMR, presented at [facility] ED with complaining of dark stools last week on Thursday and Friday, no BM afterwards.  Patient was having some nausea, feeling lightheaded and dizzy on Thursday, no any other active issues.  Denied any chest pain or palpitation, no shortness of breath.  ED Course: VS tachycardia heart rate 110, BP 153/77 slightly elevated BMP and LFTs within normal range except calcium 8.6 Hb 8.3, MCV 104 microcytic anemia Iron deficiency transferrin saturation 4%, folic acid deficiency folate level 4.6   Review of Systems: as mentioned in the history of present illness.  All other systems reviewed and are negative.  Past Medical History:  Diagnosis Date   Asthma    Depression    Insomnia    Past Surgical History:  Procedure Laterality Date   COLONOSCOPY     COLONOSCOPY N/A 04/15/2022   Procedure: COLONOSCOPY;  Surgeon: Jaynie Collins, DO;  Location: [facility] ENDOSCOPY;  Service: Gastroenterology;  Laterality: N/A;   ESOPHAGOGASTRODUODENOSCOPY     ESOPHAGOGASTRODUODENOSCOPY N/A 04/15/2022   Procedure: ESOPHAGOGASTRODUODENOSCOPY (EGD);  Surgeon: Jaynie Collins, DO;  Location: [facility] ENDOSCOPY;  Service: Gastroenterology;  Laterality: N/A;   FOOT SURGERY     NISSEN FUNDOPLICATION N/A 1995   SHOULDER SURGERY Left 05/03/2018   Social History:  reports that she has never smoked. She has never used smokeless tobacco. She reports that she does not currently use  alcohol. She reports that she does not use drugs.  Allergies  Allergen Reactions   No Known Allergies    Other     Family history reviewed and not pertinent Family History  Problem Relation Age of Onset   Alzheimer's disease Mother    Dementia Mother    Hypertension Mother    Diabetes Mother    Stroke Mother    Other Father 23       committed suicide   Heart attack Father    Heart disease Father    Stroke Father    Breast cancer Neg Hx      Prior to Admission medications   Medication Sig Start Date End Date Taking? Authorizing Provider  ALPRAZolam Prudy Feeler) 1 MG tablet Take 1 mg by mouth 2 (two) times daily as needed. 12/21/21   [provider]  alprazolam Prudy Feeler) 2 MG tablet Take 2 mg by mouth at bedtime as needed for sleep. CBC    [provider]  amphetamine-dextroamphetamine (ADDERALL) 20 MG tablet Take 20 mg by mouth 2 (two) times daily. CBC    [provider]  amphetamine-dextroamphetamine (ADDERALL) 20 MG tablet Take 1 tablet by mouth 3 (three) times daily.    [provider]  ARIPiprazole (ABILIFY) 2 MG tablet Take 2 mg by mouth daily. CBC    [provider]  desvenlafaxine (PRISTIQ) 100 MG 24 hr tablet Take 100 mg by mouth daily. CBC    [provider]  Ferrous Sulfate (IRON) 142 (45 Fe) MG TBCR Take 1 tablet by mouth daily. 11/19/21   Roxan Hockey,  Luisa Hart, MD  pantoprazole (PROTONIX) 40 MG tablet TAKE 1 TABLET(40 MG) BY MOUTH DAILY 11/19/21   Willy Eddy, MD  pantoprazole (PROTONIX) 40 MG tablet Take 1 tablet by mouth 2 (two) times daily. 11/20/21   [provider]  sucralfate (CARAFATE) 1 g tablet Take 1 tablet (1 g total) by mouth 4 (four) times daily. 11/27/21 12/27/21  Merwyn Katos, MD  VYVANSE 70 MG capsule Take 70 mg by mouth every morning. 10/26/21   [provider]  zolpidem (AMBIEN) 10 MG tablet Take 12.5 mg by mouth at bedtime as needed for sleep. CBC    [provider]     Physical Exam: Vitals:   10/18/22 1319 10/18/22 1320  BP: (!) 153/77   Pulse: (!) 110   Resp: 19   Temp: 98.2 F (36.8 C)   TempSrc: Oral   SpO2: 100%   Weight:  119.7 kg  Height:  5\' 4"  (1.626 m)    General: alert and oriented to time, place, and person. Appear in no distress, affect appropriate Eyes: PERRLA, Conjunctiva normal ENT: Oral Mucosa Clear, moist  Neck: no JVD, no Abnormal Mass Or lumps Cardiovascular: S1 and S2 Present, no Murmur, peripheral pulses symmetrical Respiratory: good respiratory effort, Bilateral Air entry equal and Decreased, no signs of accessory muscle use, Clear to Auscultation, no Crackles, no wheezes Abdomen: Bowel Sound present, Soft and mild epigastric tenderness, no hernia Skin: no rashes  Extremities: no Pedal edema, no calf tenderness Neurologic: without any new focal findings Gait not checked due to patient safety concerns  Data Reviewed: I have personally reviewed and interpreted labs, imaging as discussed below.  CBC: Recent Labs  Lab 10/18/22 1322  WBC 8.6  HGB 8.3*  HCT 26.6*  MCV 104.3*  PLT 320   Basic Metabolic Panel: Recent Labs  Lab 10/18/22 1322  NA 135  K 3.5  CL 101  CO2 24  GLUCOSE 98  BUN 14  CREATININE 0.76  CALCIUM 8.6*   GFR: Estimated Creatinine Clearance: 95.3 mL/min (by C-G formula based on SCr of 0.76 mg/dL). Liver Function Tests: Recent Labs  Lab 10/18/22 1322  AST 40  ALT 29  ALKPHOS 69  BILITOT 0.5  PROT 7.1  ALBUMIN 3.7   No results for input(s): "LIPASE", "AMYLASE" in the last 168 hours. No results for input(s): "AMMONIA" in the last 168 hours. Coagulation Profile: No results for input(s): "INR", "PROTIME" in the last 168 hours. Cardiac Enzymes: No results for input(s): "CKTOTAL", "CKMB", "CKMBINDEX", "TROPONINI" in the last 168 hours. BNP (last 3 results) No results for input(s): "PROBNP" in the last 8760 hours. HbA1C: No results for input(s): "HGBA1C" in the last 72  hours. CBG: No results for input(s): "GLUCAP" in the last 168 hours. Lipid Profile: No results for input(s): "CHOL", "HDL", "LDLCALC", "TRIG", "CHOLHDL", "LDLDIRECT" in the last 72 hours. Thyroid Function Tests: No results for input(s): "TSH", "T4TOTAL", "FREET4", "T3FREE", "THYROIDAB" in the last 72 hours. Anemia Panel: No results for input(s): "VITAMINB12", "FOLATE", "FERRITIN", "TIBC", "IRON", "RETICCTPCT" in the last 72 hours. Urine analysis: No results found for: "COLORURINE", "APPEARANCEUR", "LABSPEC", "PHURINE", "GLUCOSEU", "HGBUR", "BILIRUBINUR", "KETONESUR", "PROTEINUR", "UROBILINOGEN", "NITRITE", "LEUKOCYTESUR"  Radiological Exams on Admission: No results found.   I reviewed all nursing notes, pharmacy notes, vitals, pertinent old records.  Assessment/Plan Principal Problem:   Acute upper GI bleeding  # Acute upper GI bleeding Baseline Hb 14.2, 7 months ago Hb 8.3 on admission Started pantoprazole 40 mg IV twice daily Started clear liquid diet, n.p.o.  after midnight GI consulted for possible EGD tomorrow a.m. Follow GI for further recommendation Monitor H&H and transfuse if hemoglobin less than 7   # Iron deficiency anemia, transferrin saturation 4% Started Venofer 200 mg IV daily during hospital stay followed by oral supplement. Follow with PCP to repeat iron profile after 3 to 6 months  # Folic acid deficiency, folate level 4.6, started folic acid 1 mg p.o. daily Follow with PCP to repeat folate level after for 3 to 6 months   #History of depression, ADD and insomnia Resumed Home medications   Nutrition: Clear liquid diet DVT Prophylaxis: SCD, pharmacological prophylaxis contraindicated due to GI bleeding  Advance goals of care discussion: Full code   Consults: I personally Discussed with GI Dr Servando Snare   Family Communication: family was not present at bedside, at the time of interview.  Opportunity was given to ask question and all questions were answered  satisfactorily.  Disposition: Admitted as inpatient, telemetry unit. Likely to be discharged home, in 1-2 days.  I have discussed plan of care as described above with RN and patient/family.  Severity of Illness: The appropriate patient status for this patient is INPATIENT. Inpatient status is judged to be reasonable and necessary in order to provide the required intensity of service to ensure the patient's safety. The patient's presenting symptoms, physical exam findings, and initial radiographic and laboratory data in the context of their chronic comorbidities is felt to place them at high risk for further clinical deterioration. Furthermore, it is not anticipated that the patient will be medically stable for discharge from the hospital within 2 midnights of admission.   * I certify that at the point of admission it is my clinical judgment that the patient will require inpatient hospital care spanning beyond 2 midnights from the point of admission due to high intensity of service, high risk for further deterioration and high frequency of surveillance required.*   Author: Gillis Santa, MD [facility] 10/18/2022 3:56 PM   Time spend: 21  To reach On-call, see care teams to locate the attending and reach out to them via [email or web address]. If 7PM-7AM, please contact night-coverage If you still have difficulty reaching the attending provider, please page the [facility] (Director on Call) for [facility] on amion for assistance.

## 2022-10-18 NOTE — ED Provider Notes (Signed)
[facility] Provider Note    Event Date/Time   First MD Initiated Contact with Patient 10/18/22 1453     (approximate)   History   Abdominal Pain   HPI  Catherine Page is a 62 y.o. female with a history of PUD and duodenal ulcer on omeprazole currently presents to the ER for evaluation of symptomatic anemia in the setting of several days of melena and black tarry stools.  She is not on any blood thinners.  Does not drink or smoke.  Has not been using any NSAIDs.  Was previously treated for H. pylori.  Was actually at GI clinic today given her symptoms and sent to the ER given the significant drop in her hemoglobin.     Physical Exam   Triage Vital Signs: ED Triage Vitals  Enc Vitals Group     BP 10/18/22 1319 (!) 153/77     Pulse Rate 10/18/22 1319 (!) 110     Resp 10/18/22 1319 19     Temp 10/18/22 1319 98.2 F (36.8 C)     Temp Source 10/18/22 1319 Oral     SpO2 10/18/22 1319 100 %     Weight 10/18/22 1320 264 lb (119.7 kg)     Height 10/18/22 1320 5\' 4"  (1.626 m)     Head Circumference --      Peak Flow --      Pain Score 10/18/22 1320 0     Pain Loc --      Pain Edu? --      Excl. in GC? --     Most recent vital signs: Vitals:   10/18/22 1319  BP: (!) 153/77  Pulse: (!) 110  Resp: 19  Temp: 98.2 F (36.8 C)  SpO2: 100%     Constitutional: Alert  Eyes: Conjunctivae are normal.  Head: Atraumatic. Nose: No congestion/rhinnorhea. Mouth/Throat: Mucous membranes are moist.   Neck: Painless ROM.  Cardiovascular:   Good peripheral circulation. Respiratory: Normal respiratory effort.  No retractions.  Gastrointestinal: Soft and nontender.  Musculoskeletal:  no deformity Neurologic:  MAE spontaneously. No gross focal neurologic deficits are appreciated.  Skin:  Skin is warm, dry and intact. No rash noted. Psychiatric: Mood and affect are normal. Speech and behavior are normal.    ED Results / Procedures / Treatments   Labs (all  labs ordered are listed, but only abnormal results are displayed) Labs Reviewed  COMPREHENSIVE METABOLIC PANEL - Abnormal; Notable for the following components:      Result Value   Calcium 8.6 (*)    All other components within normal limits  CBC - Abnormal; Notable for the following components:   RBC 2.55 (*)    Hemoglobin 8.3 (*)    HCT 26.6 (*)    MCV 104.3 (*)    nRBC 0.6 (*)    All other components within normal limits  POC OCCULT BLOOD, ED  TYPE AND SCREEN     EKG     RADIOLOGY    PROCEDURES:  Critical Care performed:   Procedures   MEDICATIONS ORDERED IN ED: Medications  pantoprazole (PROTONIX) injection 40 mg (has no administration in time range)     IMPRESSION / MDM / ASSESSMENT AND PLAN / ED COURSE  I reviewed the triage vital signs and the nursing notes.                              Differential diagnosis  includes, but is not limited to, ugib, lgib, pud, smptomatic anemia, ida  Patient presenting to the ER for evaluation of symptoms as described above.  Based on symptoms, risk factors and considered above differential, this presenting complaint could reflect a potentially life-threatening illness therefore the patient will be placed on continuous pulse oximetry and telemetry for monitoring.  Laboratory evaluation will be sent to evaluate for the above complaints.  Patient does have evidence of acute anemia with hemoglobin of 8.  Hemodynamically stable.  Given history to feel patient will require admission for serial hemoglobins, hemodynamic monitoring, GI consultation.  Will give dose of IV PPI.  Consult hospitalist for admission.       FINAL CLINICAL IMPRESSION(S) / ED DIAGNOSES   Final diagnoses:  Melena  Symptomatic anemia     Rx / DC Orders   ED Discharge Orders     None        Note:  This document was prepared using Dragon voice recognition software and may include unintentional dictation errors.    Willy Eddy,  MD 10/18/22 [phone]

## 2022-10-18 NOTE — ED Notes (Signed)
Assessed both of pt's arms for placement of an IV. Will bring [place] to bedside once available as pt is hard stick.

## 2022-10-18 NOTE — ED Notes (Signed)
Pt in NAD

## 2022-10-18 NOTE — ED Notes (Signed)
Pt finished liquid dinner tray.

## 2022-10-19 ENCOUNTER — Encounter: Payer: Self-pay | Admitting: Student

## 2022-10-19 ENCOUNTER — Inpatient Hospital Stay: Payer: Federal, State, Local not specified - PPO | Admitting: Anesthesiology

## 2022-10-19 ENCOUNTER — Other Ambulatory Visit: Payer: Self-pay

## 2022-10-19 ENCOUNTER — Encounter
Admission: EM | Disposition: A | Discharge status: Home / Self Care | Payer: Self-pay | Source: Home / Self Care | Attending: Student in an Organized Health Care Education/Training Program

## 2022-10-19 DIAGNOSIS — K921 Melena: Secondary | ICD-10-CM

## 2022-10-19 DIAGNOSIS — D649 Anemia, unspecified: Secondary | ICD-10-CM | POA: Diagnosis not present

## 2022-10-19 DIAGNOSIS — K2960 Other gastritis without bleeding: Secondary | ICD-10-CM | POA: Diagnosis not present

## 2022-10-19 DIAGNOSIS — E559 Vitamin D deficiency, unspecified: Secondary | ICD-10-CM

## 2022-10-19 DIAGNOSIS — E7801 Familial hypercholesterolemia: Secondary | ICD-10-CM | POA: Diagnosis not present

## 2022-10-19 DIAGNOSIS — E538 Deficiency of other specified B group vitamins: Secondary | ICD-10-CM | POA: Diagnosis not present

## 2022-10-19 DIAGNOSIS — K922 Gastrointestinal hemorrhage, unspecified: Secondary | ICD-10-CM | POA: Diagnosis not present

## 2022-10-19 DIAGNOSIS — D500 Iron deficiency anemia secondary to blood loss (chronic): Secondary | ICD-10-CM

## 2022-10-19 DIAGNOSIS — K259 Gastric ulcer, unspecified as acute or chronic, without hemorrhage or perforation: Secondary | ICD-10-CM | POA: Diagnosis not present

## 2022-10-19 HISTORY — PX: BIOPSY: SHX5522

## 2022-10-19 HISTORY — PX: ESOPHAGOGASTRODUODENOSCOPY (EGD) WITH PROPOFOL: SHX5813

## 2022-10-19 LAB — BASIC METABOLIC PANEL
Anion gap: 9 (ref 5–15)
BUN: 12 mg/dL (ref 6–20)
CO2: 23 mmol/L (ref 22–32)
Calcium: 8.2 mg/dL — ABNORMAL LOW (ref 8.9–10.3)
Chloride: 103 mmol/L (ref 98–111)
Creatinine, Ser: 0.67 mg/dL (ref 0.44–1.00)
GFR, Estimated: >60 mL/min (ref >60)
Glucose, Bld: 97 mg/dL (ref 70–99)
Potassium: 3.7 mmol/L (ref 3.5–5.1)
Sodium: 135 mmol/L (ref 135–145)

## 2022-10-19 LAB — CBC
HCT: 25.5 % — ABNORMAL LOW (ref 36.0–46.0)
Hemoglobin: 8.2 g/dL — ABNORMAL LOW (ref 12.0–15.0)
MCH: 33.1 pg (ref 26.0–34.0)
MCHC: 32.2 g/dL (ref 30.0–36.0)
MCV: 102.8 fL — ABNORMAL HIGH (ref 80.0–100.0)
Platelets: 234 10*3/uL (ref 150–400)
RBC: 2.48 MIL/uL — ABNORMAL LOW (ref 3.87–5.11)
RDW: 15.1 % (ref 11.5–15.5)
WBC: 6.1 10*3/uL (ref 4.0–10.5)
nRBC: 0.5 % — ABNORMAL HIGH (ref 0.0–0.2)

## 2022-10-19 LAB — VITAMIN B12: Vitamin B-12: 216 pg/mL (ref 180–914)

## 2022-10-19 LAB — HIV ANTIBODY (ROUTINE TESTING W REFLEX): HIV Screen 4th Generation wRfx: NONREACTIVE

## 2022-10-19 LAB — MAGNESIUM: Magnesium: 2.3 mg/dL (ref 1.7–2.4)

## 2022-10-19 LAB — VITAMIN D 25 HYDROXY (VIT D DEFICIENCY, FRACTURES): Vit D, 25-Hydroxy: 13.86 ng/mL — ABNORMAL LOW (ref 30–100)

## 2022-10-19 LAB — PHOSPHORUS: Phosphorus: 3.6 mg/dL (ref 2.5–4.6)

## 2022-10-19 SURGERY — ESOPHAGOGASTRODUODENOSCOPY (EGD) WITH PROPOFOL
Anesthesia: General

## 2022-10-19 MED ORDER — SODIUM CHLORIDE 0.9% FLUSH: 10.0000 mL | INTRAVENOUS | Status: DC | PRN

## 2022-10-19 MED ORDER — VITAMIN D (ERGOCALCIFEROL) 1.25 MG (50000 UNIT) PO CAPS
50000.0000 [IU] | ORAL_CAPSULE | ORAL | Status: DC
  Administered 2022-10-19: 50000 [IU] via ORAL
  Filled 2022-10-19: qty 1

## 2022-10-19 MED ORDER — VITAMIN B-12 1000 MCG PO TABS
1000.0000 ug | ORAL_TABLET | Freq: Every day | ORAL | Status: DC
  Administered 2022-10-19 – 2022-10-21 (×3): 1000 ug via ORAL
  Filled 2022-10-19 (×3): qty 1

## 2022-10-19 MED ORDER — DEXMEDETOMIDINE HCL IN NACL 80 MCG/20ML IV SOLN
INTRAVENOUS | Status: DC | PRN
  Administered 2022-10-19: 8 ug via INTRAVENOUS

## 2022-10-19 MED ORDER — CHLORHEXIDINE GLUCONATE CLOTH 2 % EX PADS
6.0000 | MEDICATED_PAD | Freq: Every day | CUTANEOUS | Status: DC
  Administered 2022-10-19 – 2022-10-21 (×3): 6 via TOPICAL

## 2022-10-19 MED ORDER — SODIUM CHLORIDE 0.9% FLUSH
10.0000 mL | Freq: Two times a day (BID) | INTRAVENOUS | Status: DC
  Administered 2022-10-19 (×2): 10 mL

## 2022-10-19 MED ORDER — PROPOFOL 10 MG/ML IV BOLUS
INTRAVENOUS | Status: DC | PRN
  Administered 2022-10-19: 100 mg via INTRAVENOUS
  Administered 2022-10-19 (×2): 50 mg via INTRAVENOUS

## 2022-10-19 NOTE — Transfer of Care (Signed)
Immediate Anesthesia Transfer of Care Note  Patient: Ondria Bomba  Procedure(s) Performed: ESOPHAGOGASTRODUODENOSCOPY (EGD) WITH PROPOFOL  Patient Location: PACU  Anesthesia Type:General  Level of Consciousness: drowsy  Airway & Oxygen Therapy: Patient Spontanous Breathing and Patient connected to nasal cannula oxygen  Post-op Assessment: Report given to RN and Post -op Vital signs reviewed and stable  Post vital signs: Reviewed and stable  Last Vitals:  Vitals Value Taken Time  BP    Temp    Pulse    Resp    SpO2      Last Pain:  Vitals:   10/19/22 1207  TempSrc: Temporal  PainSc: 0-No pain         Complications: No notable events documented.

## 2022-10-19 NOTE — Progress Notes (Signed)
  Progress Note   Patient: Catherine Page NFA:[id number] DOB: 1960/07/04 DOA: 10/18/2022     1 DOS: the patient was seen and examined on 10/19/2022   Brief hospital course: No notes on file  Assessment and Plan: Acute upper GI bleeding Baseline Hb 14.2, 7 months ago, dropped to 8 this morning. Continue pantoprazole 40 mg IV twice daily. GI planned for possible EGD today a.m. Further recommendations per EGD findings. Monitor H&H and transfuse if hemoglobin less than 7. Due to her poor IV access, I ordered PICC line as she will need EGD and possible blood transfusion.   Iron deficiency anemia, transferrin saturation 4% She got IV iron therapy. Oral supplements prior to discharge.   Folic acid deficiency B12 lower side Vitamin D low- Continue folic acid 1 mg p.o. daily. Will order b12, vit d supplements. Follow with PCP to repeat vitamin levels after for 3 to 6 months.  Morbid obesity- BMI 45.3 Advised diet, exercise and weight reduction. Life style modifications.  DVT prophylaxis SCD CODE STATUS- FULL CODE.     Subjective: Patient is lying comfortably. Denies any pain. No more episodes of bleeding. She is NPO awaiting EGD.   Physical Exam: Vitals:   10/19/22 1217 10/19/22 1248 10/19/22 1258 10/19/22 1309  BP: 130/60 (!) 113/58 128/60 (!) 147/62  Pulse:  83 78 73  Resp:  (!) 22 20 15   Temp:    (!) 96.3 F (35.7 C)  TempSrc:    Temporal  SpO2:  96% 99% 99%  Weight:      Height:       General - Elderly Caucasian female, no distress HEENT - PERRLA, EOMI, atraumatic head, non tender sinuses. Lung - Clear, rales, rhonchi, wheezes. Heart - S1, S2 heard, no murmurs, rubs, trace pedal edema Neuro - Alert, awake and oriented, non focal exam. Skin - Warm and dry. Data Reviewed:  CBC, H/H trend, BMP  Family Communication: Patient understands and agrees with above plan.  Disposition: Status is: Inpatient Remains inpatient appropriate because: GI procedure and management  accordingly.  Planned Discharge Destination: Home    Time spent: 45 minutes  Author: Marcelino Duster, MD 10/19/2022 1:50 PM  For on call review [email or web address].

## 2022-10-19 NOTE — Consult Note (Signed)
Catherine Minium, MD [facility]  [address], [address] [place], [place] [address] Phone: [phone] Fax : [phone]  Consultation  Referring Provider:     Dr. Lucianne Muss Primary Care Physician:  Duanne Limerick, MD Primary Gastroenterologist:  Dr. Timothy Lasso         Reason for Consultation:     Melena  Date of Admission:  10/18/2022 Date of Consultation:  10/19/2022         HPI:   Catherine Page is a 62 y.o. female who has a history of peptic ulcer disease with a duodenal ulcer previously.  The patient had been on NSAIDs prior to having the previous duodenal ulcer but has stopped all NSAIDs.  She was also previously treated for H. pylori but does not recall being tested to see if the H. pylori has been eradicated.  Patient came in with lightheadedness to her GI doctor with some shortness of breath.  The patient's labs were checked and the patient was found to have a hemoglobin of 8.0.  The patient was then sent to the emergency department.  The patient had been having black stools for about a week and states that her last black stool was a few days ago.  She denies any hematemesis.  Patient EGD and colonoscopy were back on December 2023 with small polyps found in the colon which were hyperplastic.  The patient's stomach showed chronic gastritis with focal intestinal metaplasia and H. pylori with esophageal biopsies showing reflux esophagitis.  Past Medical History:  Diagnosis Date  . Asthma   . Depression   . Insomnia     Past Surgical History:  Procedure Laterality Date  . COLONOSCOPY    . COLONOSCOPY N/A 04/15/2022   Procedure: COLONOSCOPY;  Surgeon: Jaynie Collins, DO;  Location: [facility] ENDOSCOPY;  Service: Gastroenterology;  Laterality: N/A;  . ESOPHAGOGASTRODUODENOSCOPY    . ESOPHAGOGASTRODUODENOSCOPY N/A 04/15/2022   Procedure: ESOPHAGOGASTRODUODENOSCOPY (EGD);  Surgeon: Jaynie Collins, DO;  Location: [facility] ENDOSCOPY;  Service: Gastroenterology;  Laterality: N/A;  . FOOT  SURGERY    . NISSEN FUNDOPLICATION N/A 1995  . SHOULDER SURGERY Left 05/03/2018    Prior to Admission medications   Medication Sig Start Date End Date Taking? Authorizing Provider  ALPRAZolam Prudy Feeler) 1 MG tablet Take 1 mg by mouth 2 (two) times daily as needed. 12/21/21  Yes [provider]  amphetamine-dextroamphetamine (ADDERALL) 20 MG tablet Take 20 mg by mouth 3 (three) times daily.   Yes [provider]  ARIPiprazole (ABILIFY) 2 MG tablet Take 2 mg by mouth daily. CBC   Yes [provider]  busPIRone (BUSPAR) 15 MG tablet Take 15 mg by mouth 2 (two) times daily. 09/28/22  Yes [provider]  desvenlafaxine (PRISTIQ) 100 MG 24 hr tablet Take 100 mg by mouth daily. CBC   Yes [provider]  Fluticasone-Umeclidin-Vilant (TRELEGY ELLIPTA) 200-62.5-25 MCG/ACT AEPB Inhale 1 puff into the lungs daily. 01/07/22  Yes [provider]  omeprazole (PRILOSEC) 40 MG capsule Take 40 mg by mouth 2 (two) times daily.   Yes [provider]  zolpidem (AMBIEN CR) 12.5 MG CR tablet Take 12.5 mg by mouth at bedtime.   Yes [provider]  alprazolam Prudy Feeler) 2 MG tablet Take 2 mg by mouth at bedtime as needed for sleep. CBC Patient not taking: Reported on 10/18/2022    [provider]  amphetamine-dextroamphetamine (ADDERALL) 20 MG tablet Take 20 mg by mouth 2 (two) times daily. CBC  [provider]  doxycycline (MONODOX) 100 MG capsule Take 100 mg by mouth 2 (two) times daily. 10/15/22   [provider]  Ferrous Sulfate (IRON) 142 (45 Fe) MG TBCR Take 1 tablet by mouth daily. Patient not taking: Reported on 10/18/2022 11/19/21   Willy Eddy, MD  metroNIDAZOLE (FLAGYL) 250 MG tablet Take 250 mg by mouth 2 (two) times daily. 10/15/22   [provider]  pantoprazole (PROTONIX) 40 MG tablet TAKE 1 TABLET(40 MG) BY MOUTH DAILY 11/19/21   Willy Eddy, MD  pantoprazole (PROTONIX) 40 MG tablet Take 40 mg  by mouth 2 (two) times daily. Patient not taking: Reported on 10/18/2022 11/20/21   [provider]  sucralfate (CARAFATE) 1 g tablet Take 1 tablet (1 g total) by mouth 4 (four) times daily. 11/27/21 12/27/21  Merwyn Katos, MD  VYVANSE 70 MG capsule Take 70 mg by mouth every morning. 10/26/21   [provider]    Family History  Problem Relation Age of Onset  . Alzheimer's disease Mother   . Dementia Mother   . Hypertension Mother   . Diabetes Mother   . Stroke Mother   . Other Father 83       committed suicide  . Heart attack Father   . Heart disease Father   . Stroke Father   . Cancer Brother   . Breast cancer Neg Hx      Social History   Tobacco Use  . Smoking status: Never  . Smokeless tobacco: Never  Vaping Use  . Vaping Use: Never used  Substance Use Topics  . Alcohol use: Not Currently    Comment: rarely  . Drug use: Never    Allergies as of 10/18/2022  . (No Known Allergies)    Review of Systems:    All systems reviewed and negative except where noted in HPI.   Physical Exam:  Vital signs in last 24 hours: Temp:  [98.2 F (36.8 C)-98.4 F (36.9 C)] 98.2 F (36.8 C) (06/18 0504) Pulse Rate:  [81-110] 81 (06/18 0504) Resp:  [13-19] 18 (06/18 0504) BP: (107-153)/(43-83) 107/63 (06/18 0504) SpO2:  [97 %-100 %] 100 % (06/18 0504) Weight:  [119.7 kg] 119.7 kg (06/17 1320)   General:   Pleasant, cooperative in NAD Head:  Normocephalic and atraumatic. Eyes:   No icterus.   Conjunctiva pink. PERRLA. Ears:  Normal auditory acuity. Neck:  Supple; no masses or thyroidomegaly Lungs: Respirations even and unlabored. Lungs clear to auscultation bilaterally.   No wheezes, crackles, or rhonchi.  Heart:  Regular rate and rhythm;  Without murmur, clicks, rubs or gallops Abdomen:  Soft, nondistended, nontender. Normal bowel sounds. No appreciable masses or hepatomegaly.  No rebound or guarding.  Rectal:  Not performed. Msk:  Symmetrical without gross  deformities.   Extremities:  Without edema, cyanosis or clubbing. Neurologic:  Alert and oriented x3;  grossly normal neurologically. Skin:  Intact without significant lesions or rashes. Cervical Nodes:  No significant cervical adenopathy. Psych:  Alert and cooperative. Normal affect.  LAB RESULTS: Recent Labs    10/18/22 1322 10/19/22 0607  WBC 8.6 6.1  HGB 8.3* 8.2*  HCT 26.6* 25.5*  PLT 320 234   BMET Recent Labs    10/18/22 1322 10/19/22 0607  NA 135 135  K 3.5 3.7  CL 101 103  CO2 24 23  GLUCOSE 98 97  BUN 14 12  CREATININE 0.76 0.67  CALCIUM 8.6* 8.2*   LFT Recent Labs  10/18/22 1322  PROT 7.1  ALBUMIN 3.7  AST 40  ALT 29  ALKPHOS 69  BILITOT 0.5   PT/INR No results for input(s): "LABPROT", "INR" in the last 72 hours.  STUDIES: No results found.    Impression / Plan:   Assessment: Principal Problem:   Acute upper GI bleeding   Mataya Hauge is a 62 y.o. y/o female with a history of peptic ulcer disease with melena for approximately a week.  The patient has not had any further black stools in the last couple of days.  She has been feeling weak and lightheaded and therefore was seen by her GI doctor who found her hemoglobin to be 8.  In the emergency department yesterday the hemoglobin found to be 8.3 with a repeat this morning at 8.2.  The patient was receiving IV iron but it was reported that the IV was no longer working and the patient is waiting for an IV to be started.  Patient's iron studies showed low iron and iron saturation with a low folate also noted.  Plan:  This patient likely had an upper GI bleed and was the cause of her melena.  The patient is not having any further melena therefore it is likely that the GI bleeding has subsided.  The patient will be brought up for a upper endoscopy today.  The patient is awaiting a IV due to poor IV access.  The patient has been explained the plan and agrees with it.  Thank you for involving me in the  care of this patient.      LOS: 1 day   Catherine Minium, MD, [facility] 10/19/2022, 7:03 AM,  Pager [phone] 7am-5pm  Check AMION for 5pm -7am coverage and on weekends   Note: This dictation was prepared with Dragon dictation along with smaller phrase technology. Any transcriptional errors that result from this process are unintentional.

## 2022-10-19 NOTE — Anesthesia Preprocedure Evaluation (Signed)
Anesthesia Evaluation  Patient identified by MRN, date of birth, ID band Patient awake    Reviewed: Allergy & Precautions, NPO status , Patient's Chart, lab work & pertinent test results  History of Anesthesia Complications Negative for: history of anesthetic complications  Airway Mallampati: II  TM Distance: >3 FB Neck ROM: Full    Dental no notable dental hx. (+) Teeth Intact   Pulmonary asthma , neg sleep apnea, neg COPD, Patient abstained from smoking.Not current smoker   Pulmonary exam normal breath sounds clear to auscultation       Cardiovascular Exercise Tolerance: Good METS(-) hypertension(-) CAD and (-) Past MI negative cardio ROS (-) dysrhythmias  Rhythm:Regular Rate:Normal - Systolic murmurs    Neuro/Psych  PSYCHIATRIC DISORDERS  Depression    negative neurological ROS     GI/Hepatic ,neg GERD  ,,(+)     (-) substance abuse    Endo/Other  neg diabetes  Morbid obesity  Renal/GU negative Renal ROS     Musculoskeletal   Abdominal  (+) + obese  Peds  Hematology  (+) Blood dyscrasia, anemia   Anesthesia Other Findings Past Medical History: No date: Asthma No date: Depression No date: Insomnia  Reproductive/Obstetrics                             Anesthesia Physical Anesthesia Plan  ASA: 3  Anesthesia Plan: General   Post-op Pain Management: Minimal or no pain anticipated   Induction: Intravenous  PONV Risk Score and Plan: 3 and Propofol infusion, TIVA and Ondansetron  Airway Management Planned: Nasal Cannula  Additional Equipment: None  Intra-op Plan:   Post-operative Plan:   Informed Consent: I have reviewed the patients History and Physical, chart, labs and discussed the procedure including the risks, benefits and alternatives for the proposed anesthesia with the patient or authorized representative who has indicated his/her understanding and acceptance.      Dental advisory given  Plan Discussed with: CRNA and Surgeon  Anesthesia Plan Comments: (Discussed risks of anesthesia with patient, including possibility of difficulty with spontaneous ventilation under anesthesia necessitating airway intervention, PONV, and rare risks such as cardiac or respiratory or neurological events, and allergic reactions. Discussed the role of CRNA in patient's perioperative care. Patient understands. Patient informed about increased incidence of above perioperative risk due to high BMI. Patient understands.  Denies nausea/vomiting)       Anesthesia Quick Evaluation

## 2022-10-19 NOTE — Anesthesia Postprocedure Evaluation (Signed)
Anesthesia Post Note  Patient: Catherine Page  Procedure(s) Performed: ESOPHAGOGASTRODUODENOSCOPY (EGD) WITH PROPOFOL  Patient location during evaluation: Endoscopy Anesthesia Type: General Level of consciousness: awake and alert Pain management: pain level controlled Vital Signs Assessment: post-procedure vital signs reviewed and stable Respiratory status: spontaneous breathing, nonlabored ventilation, respiratory function stable and patient connected to nasal cannula oxygen Cardiovascular status: blood pressure returned to baseline and stable Postop Assessment: no apparent nausea or vomiting Anesthetic complications: no   No notable events documented.   Last Vitals:  Vitals:   10/19/22 1258 10/19/22 1309  BP: 128/60 (!) 147/62  Pulse: 78 73  Resp: 20 15  Temp:  (!) 35.7 C  SpO2: 99% 99%    Last Pain:  Vitals:   10/19/22 1309  TempSrc: Temporal  PainSc: 0-No pain                 Corinda Gubler

## 2022-10-19 NOTE — Progress Notes (Signed)
Responded to consult for IV. R arm not appropriate due to recent infiltration. X1 attempt on LFA unsuccessful. No other appropriate veins found due to size and depth of veins. Not appropriate for midline due to depth of vein. Recommend considering PICC/CVC.

## 2022-10-19 NOTE — Progress Notes (Signed)
Peripherally Inserted Central Catheter Placement  The IV Nurse has discussed with the patient and/or persons authorized to consent for the patient, the purpose of this procedure and the potential benefits and risks involved with this procedure.  The benefits include less needle sticks, lab draws from the catheter, and the patient may be discharged home with the catheter. Risks include, but not limited to, infection, bleeding, blood clot (thrombus formation), and puncture of an artery; nerve damage and irregular heartbeat and possibility to perform a PICC exchange if needed/ordered by physician.  Alternatives to this procedure were also discussed.  Bard Power PICC patient education guide, fact sheet on infection prevention and patient information card has been provided to patient /or left at bedside.    PICC Placement Documentation  PICC Double Lumen 10/19/22 Left Cephalic 42 cm 0 cm (Active)  Indication for Insertion or Continuance of Line Poor Vasculature-patient has had multiple peripheral attempts or PIVs lasting less than 24 hours 10/19/22 0912  Exposed Catheter (cm) 0 cm 10/19/22 0912  Site Assessment Clean, Dry, Intact 10/19/22 0912  Lumen #1 Status Flushed;Saline locked;Blood return noted 10/19/22 0912  Lumen #2 Status Flushed;Saline locked;Blood return noted 10/19/22 0912  Dressing Type Transparent;Securing device 10/19/22 0912  Dressing Status Antimicrobial disc in place;Clean, Dry, Intact 10/19/22 0912  Safety Lock Not Applicable 10/19/22 0912  Line Care Connections checked and tightened 10/19/22 0912  Line Adjustment (NICU/IV Team Only) No 10/19/22 0912  Dressing Intervention New dressing 10/19/22 0912  Dressing Change Due 10/26/22 10/19/22 0912       Catherine Page 10/19/2022, 9:12 AM

## 2022-10-19 NOTE — Op Note (Signed)
[facility] Patient Name: Catherine Page Procedure Date: 10/19/2022 12:29 PM MRN: [id number] Account #: [id number] Date of Birth: 11/15/1960 Admit Type: Outpatient Age: 62 Room: [facility] ENDO ROOM 3 Gender: Female Note Status: Finalized Instrument Name: Upper Endoscope [id number] Procedure:             Upper GI endoscopy Indications:           Melena Providers:             Midge Minium MD, MD Medicines:             Propofol per Anesthesia Complications:         No immediate complications. Procedure:             Pre-Anesthesia Assessment:                        - Prior to the procedure, a History and Physical was                         performed, and patient medications and allergies were                         reviewed. The patient's tolerance of previous                         anesthesia was also reviewed. The risks and benefits                         of the procedure and the sedation options and risks                         were discussed with the patient. All questions were                         answered, and informed consent was obtained. Prior                         Anticoagulants: The patient has taken no anticoagulant                         or antiplatelet agents. ASA Grade Assessment: II - A                         patient with mild systemic disease. After reviewing                         the risks and benefits, the patient was deemed in                         satisfactory condition to undergo the procedure.                        After obtaining informed consent, the endoscope was                         passed under direct vision. Throughout the procedure,                         the patient's blood pressure,  pulse, and oxygen                         saturations were monitored continuously. The Endoscope                         was introduced through the mouth, and advanced to the                         second part of duodenum. The  upper GI endoscopy was                         accomplished without difficulty. The patient tolerated                         the procedure well. Findings:      The examined esophagus was normal.      Mild inflammation was found at the pylorus. Biopsies were taken with a       cold forceps for histology.      A moderate post-ulcer deformity was found in the first portion of the       duodenum.      Evidence of a Nissen fundoplication was found in the gastric fundus. Impression:            - Normal esophagus.                        - Gastritis. Biopsied.                        - Duodenal deformity.                        - A Nissen fundoplication was found. Recommendation:        - Return patient to hospital ward for ongoing care.                        - Resume previous diet.                        - Continue present medications.                        - Use a proton pump inhibitor PO daily. Procedure Code(s):     --- Professional ---                        [phone], Esophagogastroduodenoscopy, flexible,                         transoral; with biopsy, single or multiple Diagnosis Code(s):     --- Professional ---                        K92.1, Melena (includes Hematochezia)                        K31.89, Other diseases of stomach and duodenum CPT copyright 2022 American Medical Association. All rights reserved. The codes documented in this report are preliminary and upon coder review may  be revised to meet current compliance requirements. Midge Minium MD, MD 10/19/2022 12:46:19  PM This report has been signed electronically. Number of Addenda: 0 Note Initiated On: 10/19/2022 12:29 PM Estimated Blood Loss:  Estimated blood loss: none.      [facility]

## 2022-10-20 ENCOUNTER — Encounter: Payer: Self-pay | Admitting: Gastroenterology

## 2022-10-20 DIAGNOSIS — K922 Gastrointestinal hemorrhage, unspecified: Secondary | ICD-10-CM | POA: Diagnosis not present

## 2022-10-20 DIAGNOSIS — D649 Anemia, unspecified: Secondary | ICD-10-CM

## 2022-10-20 LAB — CBC
HCT: 22.6 % — ABNORMAL LOW (ref 36.0–46.0)
Hemoglobin: 7.3 g/dL — ABNORMAL LOW (ref 12.0–15.0)
MCH: 32.6 pg (ref 26.0–34.0)
MCHC: 32.3 g/dL (ref 30.0–36.0)
MCV: 100.9 fL — ABNORMAL HIGH (ref 80.0–100.0)
Platelets: 262 10*3/uL (ref 150–400)
RBC: 2.24 MIL/uL — ABNORMAL LOW (ref 3.87–5.11)
RDW: 15 % (ref 11.5–15.5)
WBC: 5.1 10*3/uL (ref 4.0–10.5)
nRBC: 1.2 % — ABNORMAL HIGH (ref 0.0–0.2)

## 2022-10-20 LAB — CBC WITH DIFFERENTIAL/PLATELET
Abs Immature Granulocytes: 0.04 10*3/uL (ref 0.00–0.07)
Basophils Absolute: 0 10*3/uL (ref 0.0–0.1)
Basophils Relative: 0 %
Eosinophils Absolute: 0.1 10*3/uL (ref 0.0–0.5)
Eosinophils Relative: 1 %
HCT: 26.7 % — ABNORMAL LOW (ref 36.0–46.0)
Hemoglobin: 8.6 g/dL — ABNORMAL LOW (ref 12.0–15.0)
Immature Granulocytes: 1 %
Lymphocytes Relative: 29 %
Lymphs Abs: 2.1 10*3/uL (ref 0.7–4.0)
MCH: 32 pg (ref 26.0–34.0)
MCHC: 32.2 g/dL (ref 30.0–36.0)
MCV: 99.3 fL (ref 80.0–100.0)
Monocytes Absolute: 0.6 10*3/uL (ref 0.1–1.0)
Monocytes Relative: 8 %
Neutro Abs: 4.4 10*3/uL (ref 1.7–7.7)
Neutrophils Relative %: 61 %
Platelets: 287 10*3/uL (ref 150–400)
RBC: 2.69 MIL/uL — ABNORMAL LOW (ref 3.87–5.11)
RDW: 15.6 % — ABNORMAL HIGH (ref 11.5–15.5)
WBC: 7.3 10*3/uL (ref 4.0–10.5)
nRBC: 1.8 % — ABNORMAL HIGH (ref 0.0–0.2)

## 2022-10-20 LAB — PREPARE RBC (CROSSMATCH)

## 2022-10-20 LAB — BPAM RBC
Blood Product Expiration Date: 202407162359
Unit Type and Rh: 6200

## 2022-10-20 MED ORDER — SODIUM CHLORIDE 0.9% IV SOLUTION: Freq: Once | INTRAVENOUS | Status: AC

## 2022-10-20 NOTE — Progress Notes (Signed)
Patient alert and oriented x 4. Patient received 1 unit of blood. Tolerated well. Closely monitoring hemoglobin. Pt vital sign with in normal limit. Up ad lib. We will continue to monitor.

## 2022-10-20 NOTE — TOC CM/SW Note (Signed)
Transition of Care [facility]) - Inpatient Brief Assessment   Patient Details  Name: Chaunte Orrick MRN: [id number] Date of Birth: 1960-06-14  Transition of Care [facility]) CM/SW Contact:    Allena Katz, LCSW Phone Number: 10/20/2022, 10:20 AM   Clinical Narrative:   No TOC needs at this time. Pt getting EGD today.    Transition of Care Asessment: Insurance and Status: Insurance coverage has been reviewed Patient has primary care physician: Yes Home environment has been reviewed: [address] [place] [address] Prior level of function:: no PT/OT needs a this trime. Prior/Current Home Services: No current home services Social Determinants of Health Reivew: SDOH reviewed no interventions necessary Readmission risk has been reviewed: Yes Transition of care needs: no transition of care needs at this time

## 2022-10-20 NOTE — Progress Notes (Signed)
PROGRESS NOTE  Catherine Page    DOB: 1960-09-22, 62 y.o.  ZOX:[id number]    Code Status: Full Code   DOA: 10/18/2022   LOS: 2   Brief hospital course  Catherine Page is a 62 y.o. female with a PMH significant for duodenal ulcer s/p H. pylori treatment, HLD, depression, ADD, insomnia, iron deficiency anemia.  They presented from home to the ED on 10/18/2022 with dark stools x 2 days.  She also endorses some lightheadedness, nausea.  She denies chest pain or palpitations.  In the ED, it was found that they had sinus tachycardia of 110, BP 153/77 and otherwise stable vital signs.  Significant findings included hemoglobin 8.3, MCV 104, TIBC 4%.  They were initially treated with IV PPI, IV fluids. GI was consulted.   Patient was admitted to medicine service for further workup and management of GI bleed as outlined in detail below. 6/18-EGD with no clear bleeding source recognized  10/20/22 -patient is clinically stable.  However, hemoglobin has continued to decrease and she is symptomatic with dizziness upon standing.  Assessment & Plan  Principal Problem:   Acute upper GI bleeding Active Problems:   Vitamin D deficiency   Iron deficiency anemia   Melena   Folate deficiency   Symptomatic anemia  Acute blood loss anemia- hgb 7.3 from 8.2 yesterday. S/p 2 IV iron infusions Acute GI bleed, suspected upper-ED 5/18 negative for signs of acute bleed.  Patient had small watery bowel movement this morning with black flecks in it which do appear to be hemolyzed blood. -Symptomatic anemia with hemoglobin less than 8.  Will transfuse 1 unit packed RBCs today.  Patient has consented to this plan. -GI following, appreciate your care -Continue daily PPI -Continue to monitor hemoglobin -If no more overt bleeding and hemoglobin is stable she can likely be discharged home tomorrow with outpatient follow-up. -Avoid NSAIDs  ADD  depression  anxiety-stable -Continue home meds including Xanax as  needed, Adderall, Abilify, BuSpar, Ambien  Severe obesity-BMI 45.3   Body mass index is 45.3 kg/m.  VTE ppx: SCDs Start: 10/18/22 1553  Diet:     Diet   Diet regular Room service appropriate? Yes; Fluid consistency: Thin   Consultants: GI  Subjective 10/20/22    Pt reports feeling well today.  She had a very small bowel movement which had black flakes in it.  Denies abdominal pain or any other overt bleeding.  She is concerned about her hemoglobin drop today.  Denies nausea or vomiting.   Objective   Vitals:   10/19/22 1309 10/19/22 1514 10/19/22 2203 10/20/22 0458  BP: (!) 147/62 (!) 119/58 (!) 153/70 (!) 126/59  Pulse: 73 86 97 94  Resp: 15 18 16 16   Temp: (!) 96.3 F (35.7 C) 98.9 F (37.2 C) 98.4 F (36.9 C) 99 F (37.2 C)  TempSrc: Temporal     SpO2: 99% 98% 100% 93%  Weight:      Height:        Intake/Output Summary (Last 24 hours) at 10/20/2022 0703 Last data filed at 10/19/2022 1914 Gross per 24 hour  Intake 440 ml  Output --  Net 440 ml   Filed Weights   10/18/22 1320 10/19/22 1207  Weight: 119.7 kg 119.7 kg    Physical Exam:  General: awake, alert, NAD HEENT: atraumatic, clear conjunctiva, anicteric sclera, MMM, hearing grossly normal Respiratory: normal respiratory effort. Cardiovascular: quick capillary refill, normal S1/S2, RRR, no JVD, murmurs Gastrointestinal: soft, NT, ND Nervous:  A&O x3. no gross focal neurologic deficits, normal speech Extremities: moves all equally, no edema, normal tone Skin: dry, intact, normal temperature, normal color. No rashes, lesions or ulcers on exposed skin Psychiatry: normal mood, congruent affect  Labs   I have personally reviewed the following labs and imaging studies CBC    Component Value Date/Time   WBC 6.1 10/19/2022 0607   RBC 2.48 (L) 10/19/2022 0607   HGB 8.2 (L) 10/19/2022 0607   HGB 7.8 (L) 11/12/2021 1616   HCT 25.5 (L) 10/19/2022 0607   HCT 27.6 (L) 11/12/2021 1616   PLT 234 10/19/2022  0607   PLT 483 (H) 11/12/2021 1616   MCV 102.8 (H) 10/19/2022 0607   MCV 73 (L) 11/12/2021 1616   MCH 33.1 10/19/2022 0607   MCHC 32.2 10/19/2022 0607   RDW 15.1 10/19/2022 0607   RDW 17.8 (H) 11/12/2021 1616   LYMPHSABS 1.9 02/26/2022 1431   LYMPHSABS 2.2 11/12/2021 1616   MONOABS 0.5 02/26/2022 1431   EOSABS 0.2 02/26/2022 1431   EOSABS 0.4 11/12/2021 1616   BASOSABS 0.1 02/26/2022 1431   BASOSABS 0.1 11/12/2021 1616      Latest Ref Rng & Units 10/19/2022    6:07 AM 10/18/2022    1:22 PM 11/27/2021    6:38 AM  BMP  Glucose 70 - 99 mg/dL 97  98  664   BUN 6 - 20 mg/dL 12  14  14    Creatinine 0.44 - 1.00 mg/dL 4.03  4.74  2.59   Sodium 135 - 145 mmol/L 135  135  141   Potassium 3.5 - 5.1 mmol/L 3.7  3.5  3.5   Chloride 98 - 111 mmol/L 103  101  105   CO2 22 - 32 mmol/L 23  24  26    Calcium 8.9 - 10.3 mg/dL 8.2  8.6  8.8     [place] EKG SITE RITE  Result Date: 10/19/2022 If Site Rite image not attached, placement could not be confirmed due to current cardiac rhythm.   Disposition Plan & Communication  Patient status: Inpatient  Admitted From: Home Planned disposition location: Home Anticipated discharge date: 6/20 pending stabilized hemoglobin  Family Communication: None at bedside   Author: Leeroy Bock, DO [facility] 10/20/2022, 7:03 AM   Available by Epic secure chat 7AM-7PM. If 7PM-7AM, please contact night-coverage.  [facility] contact information found on [email or web address].

## 2022-10-21 ENCOUNTER — Encounter: Payer: Self-pay | Admitting: Gastroenterology

## 2022-10-21 DIAGNOSIS — K922 Gastrointestinal hemorrhage, unspecified: Secondary | ICD-10-CM | POA: Diagnosis not present

## 2022-10-21 DIAGNOSIS — D649 Anemia, unspecified: Secondary | ICD-10-CM | POA: Diagnosis not present

## 2022-10-21 LAB — TYPE AND SCREEN
ABO/RH(D): A POS
Antibody Screen: NEGATIVE
Unit division: 0

## 2022-10-21 LAB — CBC
HCT: 26.7 % — ABNORMAL LOW (ref 36.0–46.0)
Hemoglobin: 8.5 g/dL — ABNORMAL LOW (ref 12.0–15.0)
MCH: 31.8 pg (ref 26.0–34.0)
MCHC: 31.8 g/dL (ref 30.0–36.0)
MCV: 100 fL (ref 80.0–100.0)
Platelets: 254 10*3/uL (ref 150–400)
RBC: 2.67 MIL/uL — ABNORMAL LOW (ref 3.87–5.11)
RDW: 16 % — ABNORMAL HIGH (ref 11.5–15.5)
WBC: 7.2 10*3/uL (ref 4.0–10.5)
nRBC: 1.1 % — ABNORMAL HIGH (ref 0.0–0.2)

## 2022-10-21 LAB — SURGICAL PATHOLOGY

## 2022-10-21 LAB — BPAM RBC: ISSUE DATE / TIME: 202406191100

## 2022-10-21 NOTE — Discharge Summary (Signed)
Physician Discharge Summary  Patient: Catherine Page DOB: [birth year]   Code Status: Full Code Admit date: 10/18/2022 Discharge date: 10/21/2022 Disposition: Home, No home health services recommended PCP: Duanne Limerick, MD  Recommendations for Outpatient Follow-up:  Follow up with PCP within 1-2 weeks Regarding general hospital follow up and preventative care Recommend CBC Follow up with GI  Discharge Diagnoses:  Principal Problem:   Acute upper GI bleeding Active Problems:   Vitamin D deficiency   Iron deficiency anemia   Melena   Folate deficiency   Symptomatic anemia  Brief Hospital Course Summary: Catherine Page is a 62 y.o. female with a PMH significant for duodenal ulcer s/p H. pylori treatment, HLD, depression, ADD, insomnia, iron deficiency anemia.   They presented from home to the ED on 10/18/2022 with dark stools x 2 days.  She also endorses some lightheadedness, nausea.  She denies chest pain or palpitations.   In the ED, it was found that they had sinus tachycardia of 110, BP 153/77 and otherwise stable vital signs.  Significant findings included hemoglobin 8.3, MCV 104, TIBC 4%.   They were initially treated with IV PPI, IV fluids. GI was consulted.    Patient was admitted to medicine service for further workup and management of GI bleed as outlined in detail below. 6/18-EGD with no clear bleeding source recognized  6/19 -patient is hemodynamically stable.  However, hemoglobin has continued to gradually decrease and she is symptomatic with dizziness upon standing. Transfused with 1u pRBCs. 6/20: asymptomatic with stable hgb s/p her transfusion yesterday and no signs of further GI bleed. No plans for inpatient colonoscopy by GI. Hgb 8.5 on day of discharge.   She was discharged home with her chronic home medications to follow up with GI outpatient.   Discharge Condition: Good, improved Recommended discharge diet: Regular healthy  diet  Consultations: GI  Procedures/Studies: EGD  Allergies as of 10/21/2022   No Known Allergies      Medication List     STOP taking these medications    doxycycline 100 MG capsule Commonly known as: MONODOX   Iron 142 (45 Fe) MG Tbcr   metroNIDAZOLE 250 MG tablet Commonly known as: FLAGYL   omeprazole 40 MG capsule Commonly known as: PRILOSEC   sucralfate 1 g tablet Commonly known as: Carafate       TAKE these medications    ALPRAZolam 1 MG tablet Commonly known as: XANAX Take 1 mg by mouth 2 (two) times daily as needed. What changed: Another medication with the same name was removed. Continue taking this medication, and follow the directions you see here.   amphetamine-dextroamphetamine 20 MG tablet Commonly known as: ADDERALL Take 20 mg by mouth 2 (two) times daily. CBC What changed: Another medication with the same name was removed. Continue taking this medication, and follow the directions you see here.   ARIPiprazole 2 MG tablet Commonly known as: ABILIFY Take 2 mg by mouth daily. CBC   busPIRone 15 MG tablet Commonly known as: BUSPAR Take 15 mg by mouth 2 (two) times daily.   desvenlafaxine 100 MG 24 hr tablet Commonly known as: PRISTIQ Take 100 mg by mouth daily. CBC   pantoprazole 40 MG tablet Commonly known as: PROTONIX TAKE 1 TABLET(40 MG) BY MOUTH DAILY What changed: Another medication with the same name was removed. Continue taking this medication, and follow the directions you see here.   Trelegy Ellipta 200-62.5-25 MCG/ACT Aepb Generic drug: Fluticasone-Umeclidin-Vilant Inhale 1 puff into the  lungs daily.   Vyvanse 70 MG capsule Generic drug: lisdexamfetamine Take 70 mg by mouth every morning.   zolpidem 12.5 MG CR tablet Commonly known as: AMBIEN CR Take 12.5 mg by mouth at bedtime.        Follow-up Information     Midge Minium, MD. Schedule an appointment as soon as possible for a visit in 3 week(s).   Specialty:  Gastroenterology Contact information: [address] [place]  [place] [address] [phone]         Duanne Limerick, MD. Schedule an appointment as soon as possible for a visit in 3 day(s).   Specialty: Family Medicine Why: to check blood work Contact information: [address] [place] [address] [phone]                Subjective   Pt reports feeling well. Denies further signs of bleeding in stool. No orthostatic symptoms. Tolerating normal diet. Feels well to go home.   All questions and concerns were addressed at time of discharge.  Objective  Blood pressure 129/79, pulse 86, temperature 98.2 F (36.8 C), resp. rate 16, height 5\' 4"  (1.626 m), weight 119.7 kg, SpO2 100 %.   General: Pt is alert, awake, not in acute distress Cardiovascular: RRR, S1/S2 +, no rubs, no gallops Respiratory: CTA bilaterally, no wheezing, no rhonchi Abdominal: Soft, NT, ND, bowel sounds + Extremities: no edema, no cyanosis  The results of significant diagnostics from this hospitalization (including imaging, microbiology, ancillary and laboratory) are listed below for reference.   Imaging studies: [place] EKG SITE RITE  Result Date: 10/19/2022 If Site Rite image not attached, placement could not be confirmed due to current cardiac rhythm.   Labs: Basic Metabolic Panel: Recent Labs  Lab 10/18/22 1322 10/19/22 0607  NA 135 135  K 3.5 3.7  CL 101 103  CO2 24 23  GLUCOSE 98 97  BUN 14 12  CREATININE 0.76 0.67  CALCIUM 8.6* 8.2*  MG  --  2.3  PHOS  --  3.6   CBC: Recent Labs  Lab 10/18/22 1322 10/19/22 0607 10/20/22 0715 10/20/22 1428 10/21/22 0525  WBC 8.6 6.1 5.1 7.3 7.2  NEUTROABS  --   --   --  4.4  --   HGB 8.3* 8.2* 7.3* 8.6* 8.5*  HCT 26.6* 25.5* 22.6* 26.7* 26.7*  MCV 104.3* 102.8* 100.9* 99.3 100.0  PLT 320 234 262 287 254   Microbiology: Results for orders placed or performed during the hospital encounter of 12/28/17  Rapid Strep Screen ([facility] ONLY)     Status: None   Collection Time: 12/28/17  9:40 AM   Specimen: Throat; Other  Result Value Ref Range Status   Streptococcus, Group A Screen (Direct) NEGATIVE NEGATIVE Final    Comment: (NOTE) A Rapid Antigen test may result negative if the antigen level in the sample is below the detection level of this test. The FDA has not cleared this test as a stand-alone test therefore the rapid antigen negative result has reflexed to a Group A Strep culture. Performed at [facility], [address] [place], [place] [address]   Culture, group A strep     Status: None   Collection Time: 12/28/17  9:40 AM   Specimen: Throat  Result Value Ref Range Status   Specimen Description   Final    THROAT Performed at [facility], [address] [place], [place] [address]    Special Requests  Final    NONE Reflexed from [id number] Performed at [facility], [address], [place], [place] [address]    Culture   Final    NO GROUP A STREP (S.PYOGENES) ISOLATED Performed at [facility], [address], [place], [place] [address]    Report Status 12/30/2017 FINAL  Final   Time coordinating discharge: Over 30 minutes  Leeroy Bock, MD  [facility] 10/21/2022, 9:26 AM

## 2022-10-21 NOTE — Discharge Instructions (Signed)
Please make a follow up appointment with GI in a couple weeks to review your biopsy results I recommend a close follow up with your PCP within a week to recheck your blood levels. Your hemoglobin is 8.5 today. You can discuss if iron infusions are necessary at that time.  Return to closer check if you are having return of significant bleeding or symptoms of anemia.

## 2022-10-21 NOTE — Plan of Care (Signed)

## 2022-10-24 ENCOUNTER — Encounter: Payer: Self-pay | Admitting: Family Medicine

## 2022-10-28 ENCOUNTER — Inpatient Hospital Stay: Payer: Federal, State, Local not specified - PPO | Admitting: Family Medicine

## 2022-11-01 ENCOUNTER — Ambulatory Visit: Payer: Federal, State, Local not specified - PPO | Admitting: Family Medicine

## 2022-11-01 ENCOUNTER — Encounter: Payer: Self-pay | Admitting: Family Medicine

## 2022-11-01 VITALS — BP 128/78 | HR 68 | Ht 64.0 in | Wt 270.0 lb

## 2022-11-01 DIAGNOSIS — Z8719 Personal history of other diseases of the digestive system: Secondary | ICD-10-CM | POA: Diagnosis not present

## 2022-11-01 DIAGNOSIS — D500 Iron deficiency anemia secondary to blood loss (chronic): Secondary | ICD-10-CM

## 2022-11-01 NOTE — Progress Notes (Signed)
Date:  11/01/2022   Name:  Catherine Page   DOB:  April 17, 1961   MRN:  [id number]   Chief Complaint: Hospitalization Follow-up (Admitted on 10/18/22 and d/c on 10/21/22- follow up with GI/ on cancellation list and pt will call next week for cancellation)  GI Problem The primary symptoms include fatigue, diarrhea and melena. Primary symptoms do not include fever, weight loss, abdominal pain, nausea, vomiting, hematemesis, hematochezia or arthralgias.  The illness does not include dysphagia. Significant associated medical issues include irritable bowel syndrome. Associated medical issues do not include gallstones, alcohol abuse, PUD, hemorrhoids or diverticulitis.    Lab Results  Component Value Date   NA 135 10/19/2022   K 3.7 10/19/2022   CO2 23 10/19/2022   GLUCOSE 97 10/19/2022   BUN 12 10/19/2022   CREATININE 0.67 10/19/2022   CALCIUM 8.2 (L) 10/19/2022   EGFR 101 11/12/2021   GFRNONAA >60 10/19/2022   Lab Results  Component Value Date   CHOL 259 (H) 04/29/2020   HDL 56 04/29/2020   LDLCALC 183 (H) 04/29/2020   TRIG 114 04/29/2020   Lab Results  Component Value Date   TSH 1.170 11/12/2021   No results found for: "HGBA1C" Lab Results  Component Value Date   WBC 7.2 10/21/2022   HGB 8.5 (L) 10/21/2022   HCT 26.7 (L) 10/21/2022   MCV 100.0 10/21/2022   PLT 254 10/21/2022   Lab Results  Component Value Date   ALT 29 10/18/2022   AST 40 10/18/2022   ALKPHOS 69 10/18/2022   BILITOT 0.5 10/18/2022   Lab Results  Component Value Date   VD25OH 13.86 (L) 10/19/2022     Review of Systems  Constitutional:  Positive for fatigue. Negative for diaphoresis, fever, unexpected weight change and weight loss.  HENT:  Negative for congestion, nosebleeds, postnasal drip, rhinorrhea, sinus pain and sore throat.   Respiratory:  Negative for chest tightness.   Cardiovascular:  Negative for chest pain and palpitations.  Gastrointestinal:  Positive for diarrhea and melena. Negative  for abdominal pain, blood in stool, dysphagia, hematemesis, hematochezia, nausea and vomiting.  Endocrine: Negative for polydipsia and polyuria.  Genitourinary:  Negative for vaginal bleeding.  Musculoskeletal:  Negative for arthralgias.  Neurological:  Positive for light-headedness. Negative for dizziness.    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   Peroneal tendinitis 02/21/2018   Menopausal syndrome 02/02/2018    No Known Allergies  Past Surgical History:  Procedure Laterality Date   BIOPSY  10/19/2022   Procedure: BIOPSY;  Surgeon: Midge Minium, MD;  Location: [facility] ENDOSCOPY;  Service: Endoscopy;;   COLONOSCOPY     COLONOSCOPY N/A 04/15/2022   Procedure: COLONOSCOPY;  Surgeon: Jaynie Collins, DO;  [facility] ENDOSCOPY;  Service: Gastroenterology;  Laterality: N/A;   ESOPHAGOGASTRODUODENOSCOPY     ESOPHAGOGASTRODUODENOSCOPY N/A 04/15/2022   Procedure: ESOPHAGOGASTRODUODENOSCOPY (EGD);  Surgeon: Jaynie Collins, DO;  Location: [facility] ENDOSCOPY;  Service: Gastroenterology;  Laterality: N/A;   ESOPHAGOGASTRODUODENOSCOPY (EGD) WITH PROPOFOL N/A 10/19/2022   Procedure: ESOPHAGOGASTRODUODENOSCOPY (  EGD) WITH PROPOFOL;  Surgeon: Midge Minium, MD;  Location: [facility] ENDOSCOPY;  Service: Endoscopy;  Laterality: N/A;   FOOT SURGERY     NISSEN FUNDOPLICATION N/A 1995   SHOULDER SURGERY Left 05/03/2018    Social History   Tobacco Use   Smoking status: Never   Smokeless tobacco: Never   Vaping Use   Vaping Use: Never used  Substance Use Topics   Alcohol use: Not Currently    Comment: rarely   Drug use: Never     Medication list has been reviewed and updated.  Current Meds  Medication Sig   ALPRAZolam (XANAX) 1 MG tablet Take 1 mg by mouth 2 (two) times daily as needed.   amphetamine-dextroamphetamine (ADDERALL) 20 MG tablet Take 20 mg by mouth 2 (two) times daily. CBC   ARIPiprazole (ABILIFY) 2 MG tablet Take 2 mg by mouth daily. CBC   busPIRone (BUSPAR) 15 MG tablet Take 15 mg by mouth 2 (two) times daily.   desvenlafaxine (PRISTIQ) 100 MG 24 hr tablet Take 100 mg by mouth daily. CBC   Fluticasone-Umeclidin-Vilant (TRELEGY ELLIPTA) 200-62.5-25 MCG/ACT AEPB Inhale 1 puff into the lungs daily.   pantoprazole (PROTONIX) 40 MG tablet TAKE 1 TABLET(40 MG) BY MOUTH DAILY   zolpidem (AMBIEN CR) 12.5 MG CR tablet Take 12.5 mg by mouth at bedtime.       11/01/2022    3:30 PM 11/12/2021    3:17 PM 04/15/2020    8:07 AM 08/16/2019    2:11 PM  GAD 7 : Generalized Anxiety Score  Nervous, Anxious, on Edge 0 2 0 0  Control/stop worrying 0 0 0 0  Worry too much - different things 0 1 0 0  Trouble relaxing 0 0 0 0  Restless 0 0 0 0  Easily annoyed or irritable 0 1 0 0  Afraid - awful might happen 0 0 0 0  Total GAD 7 Score 0 4 0 0  Anxiety Difficulty Not difficult at all Not difficult at all  Not difficult at all       11/01/2022    3:30 PM 11/12/2021    3:17 PM 04/15/2020    8:06 AM  Depression screen PHQ 2/9  Decreased Interest 0 1 0  Down, Depressed, Hopeless 0 1 0  PHQ - 2 Score 0 2 0  Altered sleeping 0 0 1  Tired, decreased energy 0 1 0  Change in appetite 0 0 0  Feeling bad or failure about yourself  0 0 0  Trouble concentrating 0 1 0  Moving slowly or fidgety/restless 0 0 0  Suicidal thoughts 0 0 0  PHQ-9 Score 0 4 1  Difficult doing work/chores Not difficult at all Somewhat difficult Not difficult at all    BP Readings from Last 3 Encounters:   11/01/22 128/78  10/21/22 129/79  04/15/22 (!) 134/95    Physical Exam Vitals and nursing note reviewed.  Constitutional:      Appearance: Normal appearance.  HENT:     Head: Normocephalic.     Right Ear: Tympanic membrane and ear canal normal.     Left Ear: Tympanic membrane and ear canal normal.     Nose: Nose normal. No congestion or rhinorrhea.     Mouth/Throat:     Mouth: Mucous membranes are moist.  Eyes:     Pupils: Pupils are equal, round, and reactive to light.  Cardiovascular:     Rate and Rhythm: Normal rate.     Heart sounds: No  murmur heard.    No friction rub. No gallop.  Pulmonary:     Effort: No respiratory distress.     Breath sounds: No wheezing, rhonchi or rales.  Musculoskeletal:     Cervical back: Normal range of motion.  Neurological:     Mental Status: She is alert.     Wt Readings from Last 3 Encounters:  11/01/22 270 lb (122.5 kg)  10/19/22 263 lb 14.3 oz (119.7 kg)  04/15/22 255 lb 12.8 oz (116 kg)    BP 128/78   Pulse 68   Ht 5\' 4"  (1.626 m)   Wt 270 lb (122.5 kg)   LMP  (LMP Unknown)   SpO2 97%   BMI 46.35 kg/m   Assessment and Plan: 1. H/O: upper GI bleed New onset.  Patient had recent hospitalization for presentation of melena and underlying anemia.  Patient received endoscopy while hospitalized in which there was a gastritis and patient also has a history of a duodenal ulcer.  Patient is to be followed up with GI in which we have placed the necessary referral for MRI awaiting GI from [facility] to accommodate.  In the meantime we will check CBC. - CBC w/Diff/Platelet  2. Iron deficiency anemia due to chronic blood loss Chronic.  Persistent.  Patient is followed by Dr. Cathie Hoops in hematology and we will place referral for a follow-up for this as well in case there is some malabsorption concerns and the patient may need to be evaluated for possible iron infusion. - Ambulatory referral to Hematology / Oncology     Elizabeth Sauer, MD

## 2022-11-02 ENCOUNTER — Telehealth: Payer: Self-pay | Admitting: Oncology

## 2022-11-02 ENCOUNTER — Other Ambulatory Visit: Payer: Self-pay

## 2022-11-02 DIAGNOSIS — D500 Iron deficiency anemia secondary to blood loss (chronic): Secondary | ICD-10-CM

## 2022-11-02 LAB — CBC WITH DIFFERENTIAL/PLATELET
Basophils Absolute: 0 10*3/uL (ref 0.0–0.2)
Basos: 0 %
EOS (ABSOLUTE): 0.1 10*3/uL (ref 0.0–0.4)
Eos: 1 %
Hematocrit: 31.9 % — ABNORMAL LOW (ref 34.0–46.6)
Hemoglobin: 10 g/dL — ABNORMAL LOW (ref 11.1–15.9)
Immature Grans (Abs): 0 10*3/uL (ref 0.0–0.1)
Immature Granulocytes: 0 %
Lymphocytes Absolute: 2.2 10*3/uL (ref 0.7–3.1)
Lymphs: 26 %
MCH: 30.3 pg (ref 26.6–33.0)
MCHC: 31.3 g/dL — ABNORMAL LOW (ref 31.5–35.7)
MCV: 97 fL (ref 79–97)
Monocytes Absolute: 0.5 10*3/uL (ref 0.1–0.9)
Monocytes: 6 %
Neutrophils Absolute: 5.5 10*3/uL (ref 1.4–7.0)
Neutrophils: 67 %
Platelets: 361 10*3/uL (ref 150–450)
RBC: 3.3 x10E6/uL — ABNORMAL LOW (ref 3.77–5.28)
RDW: 14.6 % (ref 11.7–15.4)
WBC: 8.3 10*3/uL (ref 3.4–10.8)

## 2022-11-02 NOTE — Telephone Encounter (Signed)
This patient has a new referral from Catherine Page for Iron deficiency anemia due to chronic blood loss. She last saw Dr. Cathie Hoops 12/07/21 as a new patient had 5 iron infusions and then hasn't been back for follow up. Please advise how to schedule  Thank you   Last winter Dr. Timothy Lasso did biopsy- per patient MD said it did bleed a little more than normal.   Note to self: If appointment is next week she will see in mychart if after- she would like a call

## 2022-11-10 DIAGNOSIS — F410 Panic disorder [episodic paroxysmal anxiety] without agoraphobia: Secondary | ICD-10-CM | POA: Diagnosis not present

## 2022-11-10 DIAGNOSIS — F5104 Psychophysiologic insomnia: Secondary | ICD-10-CM | POA: Diagnosis not present

## 2022-11-10 DIAGNOSIS — F332 Major depressive disorder, recurrent severe without psychotic features: Secondary | ICD-10-CM | POA: Diagnosis not present

## 2022-11-10 DIAGNOSIS — F900 Attention-deficit hyperactivity disorder, predominantly inattentive type: Secondary | ICD-10-CM | POA: Diagnosis not present

## 2022-11-12 ENCOUNTER — Inpatient Hospital Stay: Payer: Federal, State, Local not specified - PPO

## 2022-11-15 ENCOUNTER — Ambulatory Visit: Payer: Federal, State, Local not specified - PPO | Admitting: Family Medicine

## 2022-11-15 ENCOUNTER — Inpatient Hospital Stay: Payer: Federal, State, Local not specified - PPO | Attending: Oncology

## 2022-11-15 DIAGNOSIS — D509 Iron deficiency anemia, unspecified: Secondary | ICD-10-CM | POA: Insufficient documentation

## 2022-11-15 MED FILL — Iron Sucrose Inj 20 MG/ML (Fe Equiv): INTRAVENOUS | Qty: 10 | Status: AC

## 2022-11-16 ENCOUNTER — Encounter: Payer: Self-pay | Admitting: Family Medicine

## 2022-11-16 ENCOUNTER — Inpatient Hospital Stay: Payer: Federal, State, Local not specified - PPO | Admitting: Oncology

## 2022-11-16 ENCOUNTER — Inpatient Hospital Stay: Payer: Federal, State, Local not specified - PPO

## 2022-11-17 ENCOUNTER — Inpatient Hospital Stay: Payer: Federal, State, Local not specified - PPO

## 2022-11-19 ENCOUNTER — Inpatient Hospital Stay: Payer: Federal, State, Local not specified - PPO

## 2022-11-19 MED FILL — Iron Sucrose Inj 20 MG/ML (Fe Equiv): INTRAVENOUS | Qty: 10 | Status: AC

## 2022-11-22 ENCOUNTER — Inpatient Hospital Stay: Payer: Federal, State, Local not specified - PPO

## 2022-11-22 ENCOUNTER — Inpatient Hospital Stay: Payer: Federal, State, Local not specified - PPO | Admitting: Oncology

## 2022-11-22 DIAGNOSIS — D509 Iron deficiency anemia, unspecified: Secondary | ICD-10-CM | POA: Diagnosis not present

## 2022-11-22 DIAGNOSIS — D500 Iron deficiency anemia secondary to blood loss (chronic): Secondary | ICD-10-CM

## 2022-11-22 LAB — IRON AND TIBC
Iron: 45 ug/dL (ref 28–170)
Saturation Ratios: 12 % (ref 10.4–31.8)
TIBC: 374 ug/dL (ref 250–450)
UIBC: 329 ug/dL

## 2022-11-22 LAB — CBC WITH DIFFERENTIAL (CANCER CENTER ONLY)
Abs Immature Granulocytes: 0.01 10*3/uL (ref 0.00–0.07)
Basophils Absolute: 0.1 10*3/uL (ref 0.0–0.1)
Basophils Relative: 1 %
Eosinophils Absolute: 0.1 10*3/uL (ref 0.0–0.5)
Eosinophils Relative: 1 %
HCT: 35 % — ABNORMAL LOW (ref 36.0–46.0)
Hemoglobin: 10.7 g/dL — ABNORMAL LOW (ref 12.0–15.0)
Immature Granulocytes: 0 %
Lymphocytes Relative: 31 %
Lymphs Abs: 1.9 10*3/uL (ref 0.7–4.0)
MCH: 28.5 pg (ref 26.0–34.0)
MCHC: 30.6 g/dL (ref 30.0–36.0)
MCV: 93.3 fL (ref 80.0–100.0)
Monocytes Absolute: 0.5 10*3/uL (ref 0.1–1.0)
Monocytes Relative: 8 %
Neutro Abs: 3.6 10*3/uL (ref 1.7–7.7)
Neutrophils Relative %: 59 %
Platelet Count: 368 10*3/uL (ref 150–400)
RBC: 3.75 MIL/uL — ABNORMAL LOW (ref 3.87–5.11)
RDW: 15 % (ref 11.5–15.5)
WBC Count: 6.1 10*3/uL (ref 4.0–10.5)
nRBC: 0 % (ref 0.0–0.2)

## 2022-11-22 LAB — FERRITIN: Ferritin: 20 ng/mL (ref 11–307)

## 2022-11-24 ENCOUNTER — Encounter: Payer: Self-pay | Admitting: Oncology

## 2022-11-24 ENCOUNTER — Inpatient Hospital Stay: Payer: Federal, State, Local not specified - PPO

## 2022-11-24 ENCOUNTER — Inpatient Hospital Stay (HOSPITAL_BASED_OUTPATIENT_CLINIC_OR_DEPARTMENT_OTHER): Payer: Federal, State, Local not specified - PPO | Admitting: Oncology

## 2022-11-24 VITALS — BP 134/87 | HR 89

## 2022-11-24 VITALS — BP 143/103 | HR 93 | Temp 98.7°F | Resp 18 | Wt 269.4 lb

## 2022-11-24 DIAGNOSIS — D500 Iron deficiency anemia secondary to blood loss (chronic): Secondary | ICD-10-CM

## 2022-11-24 DIAGNOSIS — E538 Deficiency of other specified B group vitamins: Secondary | ICD-10-CM

## 2022-11-24 DIAGNOSIS — D509 Iron deficiency anemia, unspecified: Secondary | ICD-10-CM | POA: Diagnosis not present

## 2022-11-24 MED ORDER — SODIUM CHLORIDE 0.9 % IV SOLN
200.0000 mg | Freq: Once | INTRAVENOUS | Status: AC
  Administered 2022-11-24: 200 mg via INTRAVENOUS
  Filled 2022-11-24: qty 200

## 2022-11-24 MED ORDER — FOLIC ACID 800 MCG PO TABS: 800.0000 ug | ORAL_TABLET | Freq: Every day | ORAL | 2 refills | Status: DC

## 2022-11-24 MED ORDER — CYANOCOBALAMIN 1000 MCG/ML IJ SOLN
1000.0000 ug | Freq: Once | INTRAMUSCULAR | Status: AC
  Administered 2022-11-24: 1000 ug via INTRAMUSCULAR

## 2022-11-24 MED ORDER — SODIUM CHLORIDE 0.9 % IV SOLN
INTRAVENOUS | Status: DC
  Filled 2022-11-24: qty 250

## 2022-11-24 NOTE — Progress Notes (Signed)
Hematology/Oncology Progress note [phone] [phone]         Patient Care Team: Duanne Limerick, MD as PCP - General (Family Medicine) Rickard Patience, MD as Consulting Physician (Oncology)   REFERRING PROVIDER: Duanne Limerick, MD  CHIEF COMPLAINTS/REASON FOR VISIT:  Anemia  ASSESSMENT & PLAN:  Iron deficiency anemia Labs are reviewed and discussed with patient. Lab Results  Component Value Date   HGB 10.7 (L) 11/22/2022   TIBC 374 11/22/2022   IRONPCTSAT 12 11/22/2022   FERRITIN 20 11/22/2022    She has recently received 3 doses of IV Venofer, 1 unit of PRBC transfusion during hospitalization. Additional 1 dose of improve her iron stores.    Folate deficiency Recommend patient to start folic acid 800 mcg daily.  Prescription sent to pharmacy.  Vitamin B12 deficiency Recommend B12 1000 mcg monthly.   Orders Placed This Encounter  Procedures   CBC with Differential (Cancer Center Only)    Standing Status:   Future    Standing Expiration Date:   11/24/2023   Iron and TIBC    Standing Status:   Future    Standing Expiration Date:   11/24/2023   Ferritin    Standing Status:   Future    Standing Expiration Date:   11/24/2023   Vitamin B12    Standing Status:   Future    Standing Expiration Date:   11/24/2023   Folate    Standing Status:   Future    Standing Expiration Date:   11/24/2023   Follow up in 3 months.  All questions were answered. The patient knows to call the clinic with any problems, questions or concerns.  Rickard Patience, MD, PhD [facility] Hematology Oncology 11/24/2022     HISTORY OF PRESENTING ILLNESS:  Catherine Page is a  62 y.o.  female with PMH listed below who was referred to me for anemia Reviewed patient's recent labs that was done.  She was found to have abnormal CBC on 11/12/2021 hemoglobin 7.8, down from hemoglobin 11 in 2021.  Ferritin was decreased at 6. 11/27/2021 with a hemoglobin of 8.1, MCV 71.5, platelet count  504,000, white count 17.9.  Primarily neutrophilia with an absolute ANC of 9.2.  Increased absolute eosinophilia Reviewed patient's previous labs ordered by primary care physician's office, anemia is chronic onset , duration is since  No aggravating or improving factors. + Fatigue, shortness of breath with exertion, lightheadedness. She had not noticed any recent bleeding such as epistaxis, hematuria or hematochezia.  Patient used to take Goody's or ibuprofen chronically.  Currently not using. She denies any pica and eats a variety of diet. Patient has history of Nissen fundoplication surgery in 1995.  She also has a history of EGD dilatation in the distant past.  She takes pantoprazole 40 mg daily for acid reflux.  Symptoms are improved with PPI. Patient has started on oral iron supplementation.  INTERVAL HISTORY Catherine Page is a 62 y.o. female who has above history reviewed by me today presents for follow up visit for anemia.  10/18/2022 - 10/21/2022, patient was admitted due to acute upper GI bleeding.  Patient was treated with IV PPI, 1 unit of PRBC transfusion, 3 doses of IV Venofer treatments during admission. 6/18-EGD with no clear bleeding source recognized  Patient continues to feel tired.  MEDICAL HISTORY:  Past Medical History:  Diagnosis Date   Asthma    Depression    Insomnia     SURGICAL HISTORY: Past Surgical History:  Procedure Laterality Date   BIOPSY  10/19/2022   Procedure: BIOPSY;  Surgeon: Midge Minium, MD;  Location: [facility] ENDOSCOPY;  Service: Endoscopy;;   COLONOSCOPY     COLONOSCOPY N/A 04/15/2022   Procedure: COLONOSCOPY;  Surgeon: Jaynie Collins, DO;  Location: [facility] ENDOSCOPY;  Service: Gastroenterology;  Laterality: N/A;   ESOPHAGOGASTRODUODENOSCOPY     ESOPHAGOGASTRODUODENOSCOPY N/A 04/15/2022   Procedure: ESOPHAGOGASTRODUODENOSCOPY (EGD);  Surgeon: Jaynie Collins, DO;  Location: [facility] ENDOSCOPY;  Service: Gastroenterology;  Laterality: N/A;    ESOPHAGOGASTRODUODENOSCOPY (EGD) WITH PROPOFOL N/A 10/19/2022   Procedure: ESOPHAGOGASTRODUODENOSCOPY (EGD) WITH PROPOFOL;  Surgeon: Midge Minium, MD;  Location: [facility] ENDOSCOPY;  Service: Endoscopy;  Laterality: N/A;   FOOT SURGERY     NISSEN FUNDOPLICATION N/A 1995   SHOULDER SURGERY Left 05/03/2018    SOCIAL HISTORY: Social History   Socioeconomic History   Marital status: Single    Spouse name: Not on file   Number of children: Not on file   Years of education: Not on file   Highest education level: Not on file  Occupational History   Not on file  Tobacco Use   Smoking status: Never   Smokeless tobacco: Never  Vaping Use   Vaping status: Never Used  Substance and Sexual Activity   Alcohol use: Not Currently    Comment: rarely   Drug use: Never   Sexual activity: Not Currently  Other Topics Concern   Not on file  Social History Narrative   Not on file   Social Determinants of Health   Financial Resource Strain: Not on file  Food Insecurity: No Food Insecurity (10/18/2022)   Hunger Vital Sign    Worried About Running Out of Food in the Last Year: Never true    Ran Out of Food in the Last Year: Never true  Transportation Needs: No Transportation Needs (10/18/2022)   PRAPARE - Administrator, Civil Service (Medical): No    Lack of Transportation (Non-Medical): No  Physical Activity: Not on file  Stress: Not on file  Social Connections: Not on file  Intimate Partner Violence: Not At Risk (10/18/2022)   Humiliation, Afraid, Rape, and Kick questionnaire    Fear of Current or Ex-Partner: No    Emotionally Abused: No    Physically Abused: No    Sexually Abused: No    FAMILY HISTORY: Family History  Problem Relation Age of Onset   Alzheimer's disease Mother    Dementia Mother    Hypertension Mother    Diabetes Mother    Stroke Mother    Other Father 70       committed suicide   Heart attack Father    Heart disease Father    Stroke Father    Cancer  Brother    Breast cancer Neg Hx     ALLERGIES:  has No Known Allergies.  MEDICATIONS:  Current Outpatient Medications  Medication Sig Dispense Refill   ALPRAZolam (XANAX) 1 MG tablet Take 1 mg by mouth 2 (two) times daily as needed.     amoxicillin (AMOXIL) 500 MG capsule Take 1,000 mg by mouth 2 (two) times daily.     amphetamine-dextroamphetamine (ADDERALL) 20 MG tablet Take 20 mg by mouth 2 (two) times daily. CBC     ARIPiprazole (ABILIFY) 2 MG tablet Take 2 mg by mouth daily. CBC     busPIRone (BUSPAR) 15 MG tablet Take 15 mg by mouth 2 (two) times daily.     clarithromycin (BIAXIN) 500 MG tablet Take  500 mg by mouth 2 (two) times daily.     desvenlafaxine (PRISTIQ) 100 MG 24 hr tablet Take 100 mg by mouth daily. CBC     Fluticasone-Umeclidin-Vilant (TRELEGY ELLIPTA) 200-62.5-25 MCG/ACT AEPB Inhale 1 puff into the lungs daily.     folic acid (FOLVITE) 800 MCG tablet Take 1 tablet (800 mcg total) by mouth daily. 90 tablet 2   pantoprazole (PROTONIX) 40 MG tablet TAKE 1 TABLET(40 MG) BY MOUTH DAILY 90 tablet 0   zolpidem (AMBIEN CR) 12.5 MG CR tablet Take 12.5 mg by mouth at bedtime.     No current facility-administered medications for this visit.    Review of Systems  Constitutional:  Positive for fatigue. Negative for appetite change, chills and fever.  HENT:   Negative for hearing loss and voice change.   Eyes:  Negative for eye problems.  Respiratory:  Positive for chest tightness and shortness of breath. Negative for cough.   Cardiovascular:  Negative for chest pain.  Gastrointestinal:  Negative for abdominal distention, abdominal pain and blood in stool.  Endocrine: Negative for hot flashes.  Genitourinary:  Negative for difficulty urinating and frequency.   Musculoskeletal:  Negative for arthralgias.  Skin:  Negative for itching and rash.  Neurological:  Positive for light-headedness. Negative for extremity weakness.  Hematological:  Negative for adenopathy.   Psychiatric/Behavioral:  Negative for confusion.      PHYSICAL EXAMINATION: ECOG PERFORMANCE STATUS: 1 - Symptomatic but completely ambulatory Vitals:   11/24/22 1414  BP: (!) 143/103  Pulse: 93  Resp: 18  Temp: 98.7 F (37.1 C)   Filed Weights   11/24/22 1414  Weight: 269 lb 6.4 oz (122.2 kg)    Physical Exam Constitutional:      General: She is not in acute distress.    Appearance: She is obese.  HENT:     Head: Normocephalic and atraumatic.  Eyes:     General: No scleral icterus. Cardiovascular:     Rate and Rhythm: Regular rhythm. Tachycardia present.     Heart sounds: Normal heart sounds.  Pulmonary:     Effort: Pulmonary effort is normal. No respiratory distress.     Breath sounds: No wheezing.  Abdominal:     General: Bowel sounds are normal. There is no distension.     Palpations: Abdomen is soft.  Musculoskeletal:        General: No deformity. Normal range of motion.     Cervical back: Normal range of motion and neck supple.  Skin:    General: Skin is warm and dry.     Coloration: Skin is pale.  Neurological:     Mental Status: She is alert and oriented to person, place, and time. Mental status is at baseline.     Cranial Nerves: No cranial nerve deficit.     Coordination: Coordination normal.  Psychiatric:        Mood and Affect: Mood normal.      LABORATORY DATA:  I have reviewed the data as listed    Latest Ref Rng & Units 11/22/2022    3:02 PM 11/01/2022    4:19 PM 10/21/2022    5:25 AM  CBC  WBC 4.0 - 10.5 K/uL 6.1  8.3  7.2   Hemoglobin 12.0 - 15.0 g/dL 13.2  44.0  8.5   Hematocrit 36.0 - 46.0 % 35.0  31.9  26.7   Platelets 150 - 400 K/uL 368  361  254       Latest Ref Rng &  Units 10/19/2022    6:07 AM 10/18/2022    1:22 PM 11/27/2021    6:38 AM  CMP  Glucose 70 - 99 mg/dL 97  98  161   BUN 6 - 20 mg/dL 12  14  14    Creatinine 0.44 - 1.00 mg/dL 0.96  0.45  4.09   Sodium 135 - 145 mmol/L 135  135  141   Potassium 3.5 - 5.1 mmol/L 3.7   3.5  3.5   Chloride 98 - 111 mmol/L 103  101  105   CO2 22 - 32 mmol/L 23  24  26    Calcium 8.9 - 10.3 mg/dL 8.2  8.6  8.8   Total Protein 6.5 - 8.1 g/dL  7.1  7.1   Total Bilirubin 0.3 - 1.2 mg/dL  0.5  0.4   Alkaline Phos 38 - 126 U/L  69  140   AST 15 - 41 U/L  40  19   ALT 0 - 44 U/L  29  12       Component Value Date/Time   IRON 45 11/22/2022 1502   TIBC 374 11/22/2022 1502   FERRITIN 20 11/22/2022 1502   FERRITIN 6 (L) 11/12/2021 1616   FERRITIN 10 04/29/2020 0000   IRONPCTSAT 12 11/22/2022 1502     RADIOGRAPHIC STUDIES: I have personally reviewed the radiological images as listed and agreed with the findings in the report. No results found.

## 2022-11-24 NOTE — Assessment & Plan Note (Signed)
Recommend B12 1000 mcg monthly.

## 2022-11-24 NOTE — Progress Notes (Signed)
Patient declined to wait the 30 minutes for post iron infusion observation today.  Post iron education done. Patient verbalized understanding. 

## 2022-11-24 NOTE — Assessment & Plan Note (Signed)
Labs are reviewed and discussed with patient. Lab Results  Component Value Date   HGB 10.7 (L) 11/22/2022   TIBC 374 11/22/2022   IRONPCTSAT 12 11/22/2022   FERRITIN 20 11/22/2022    She has recently received 3 doses of IV Venofer, 1 unit of PRBC transfusion during hospitalization. Additional 1 dose of improve her iron stores.

## 2022-11-24 NOTE — Assessment & Plan Note (Signed)
Recommend patient to start folic acid 800 mcg daily.  Prescription sent to pharmacy.

## 2022-11-30 ENCOUNTER — Ambulatory Visit: Payer: Federal, State, Local not specified - PPO | Admitting: Family Medicine

## 2022-11-30 VITALS — BP 120/78 | HR 82 | Ht 64.0 in | Wt 268.0 lb

## 2022-11-30 DIAGNOSIS — Z780 Asymptomatic menopausal state: Secondary | ICD-10-CM

## 2022-11-30 DIAGNOSIS — E538 Deficiency of other specified B group vitamins: Secondary | ICD-10-CM | POA: Diagnosis not present

## 2022-11-30 DIAGNOSIS — Z8719 Personal history of other diseases of the digestive system: Secondary | ICD-10-CM | POA: Diagnosis not present

## 2022-11-30 NOTE — Progress Notes (Signed)
Date:  11/30/2022   Name:  Catherine Page   DOB:  1961-01-19   MRN:  [id number]   Chief Complaint: Follow-up (Saw Dr Cathie Hoops)  Anemia Presents for follow-up visit. There has been no abdominal pain, bruising/bleeding easily, fever, leg swelling, light-headedness, malaise/fatigue, pallor, palpitations, paresthesias or weight loss. Signs of blood loss that are not present include hematemesis, hematochezia, melena, menorrhagia and vaginal bleeding. There are no compliance problems.     Lab Results  Component Value Date   NA 135 10/19/2022   K 3.7 10/19/2022   CO2 23 10/19/2022   GLUCOSE 97 10/19/2022   BUN 12 10/19/2022   CREATININE 0.67 10/19/2022   CALCIUM 8.2 (L) 10/19/2022   EGFR 101 11/12/2021   GFRNONAA >60 10/19/2022   Lab Results  Component Value Date   CHOL 259 (H) 04/29/2020   HDL 56 04/29/2020   LDLCALC 183 (H) 04/29/2020   TRIG 114 04/29/2020   Lab Results  Component Value Date   TSH 1.170 11/12/2021   No results found for: "HGBA1C" Lab Results  Component Value Date   WBC 6.1 11/22/2022   HGB 10.7 (L) 11/22/2022   HCT 35.0 (L) 11/22/2022   MCV 93.3 11/22/2022   PLT 368 11/22/2022   Lab Results  Component Value Date   ALT 29 10/18/2022   AST 40 10/18/2022   ALKPHOS 69 10/18/2022   BILITOT 0.5 10/18/2022   Lab Results  Component Value Date   VD25OH 13.86 (L) 10/19/2022     Review of Systems  Constitutional:  Positive for fatigue. Negative for fever, malaise/fatigue and weight loss.  Respiratory:  Negative for chest tightness, shortness of breath and wheezing.   Cardiovascular:  Negative for chest pain, palpitations and leg swelling.  Gastrointestinal:  Positive for constipation. Negative for abdominal pain, blood in stool, diarrhea, hematemesis, hematochezia, melena, nausea and rectal pain.  Genitourinary:  Negative for difficulty urinating, menorrhagia and vaginal bleeding.  Skin:  Negative for pallor.  Neurological:  Negative for light-headedness and  paresthesias.  Hematological:  Does not bruise/bleed easily.    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   Peroneal tendinitis 02/21/2018   Menopausal syndrome 02/02/2018    No Known Allergies  Past Surgical History:  Procedure Laterality Date   BIOPSY  10/19/2022   Procedure: BIOPSY;  Surgeon: Midge Minium, MD;  Location: [facility] ENDOSCOPY;  Service: Endoscopy;;   COLONOSCOPY     COLONOSCOPY N/A 04/15/2022   Procedure: COLONOSCOPY;  Surgeon: Jaynie Collins, DO;  Location: [facility] ENDOSCOPY;  Service: Gastroenterology;  Laterality: N/A;   ESOPHAGOGASTRODUODENOSCOPY     ESOPHAGOGASTRODUODENOSCOPY N/A 04/15/2022   Procedure: ESOPHAGOGASTRODUODENOSCOPY (EGD);  Surgeon: Jaynie Collins, DO;  Location: [facility] ENDOSCOPY;  Service: Gastroenterology;  Laterality: N/A;   ESOPHAGOGASTRODUODENOSCOPY (EGD) WITH PROPOFOL N/A 10/19/2022   Procedure: ESOPHAGOGASTRODUODENOSCOPY (EGD) WITH PROPOFOL;  Surgeon: Midge Minium, MD;  Location: [facility] ENDOSCOPY;  Service: Endoscopy;  Laterality: N/A;   FOOT SURGERY     NISSEN FUNDOPLICATION N/A 1995  SHOULDER SURGERY Left 05/03/2018    Social History   Tobacco Use   Smoking status: Never   Smokeless tobacco: Never  Vaping Use   Vaping status: Never Used  Substance Use Topics   Alcohol use: Not Currently    Comment: rarely   Drug use: Never     Medication list has been reviewed and updated.  Current Meds   Medication Sig   ALPRAZolam (XANAX) 1 MG tablet Take 1 mg by mouth 2 (two) times daily as needed.   amphetamine-dextroamphetamine (ADDERALL) 20 MG tablet Take 20 mg by mouth 2 (two) times daily. CBC   ARIPiprazole (ABILIFY) 2 MG tablet Take 2 mg by mouth daily. CBC   busPIRone (BUSPAR) 15 MG tablet Take 15 mg by mouth 2 (two) times daily.   clarithromycin (BIAXIN) 500 MG tablet Take 500 mg by mouth 2 (two) times daily.   desvenlafaxine (PRISTIQ) 100 MG 24 hr tablet Take 100 mg by mouth daily. CBC   Fluticasone-Umeclidin-Vilant (TRELEGY ELLIPTA) 200-62.5-25 MCG/ACT AEPB Inhale 1 puff into the lungs daily.   folic acid (FOLVITE) 800 MCG tablet Take 1 tablet (800 mcg total) by mouth daily.   pantoprazole (PROTONIX) 40 MG tablet TAKE 1 TABLET(40 MG) BY MOUTH DAILY   zolpidem (AMBIEN CR) 12.5 MG CR tablet Take 12.5 mg by mouth at bedtime.   [DISCONTINUED] amoxicillin (AMOXIL) 500 MG capsule Take 1,000 mg by mouth 2 (two) times daily.       11/01/2022    3:30 PM 11/12/2021    3:17 PM 04/15/2020    8:07 AM 08/16/2019    2:11 PM  GAD 7 : Generalized Anxiety Score  Nervous, Anxious, on Edge 0 2 0 0  Control/stop worrying 0 0 0 0  Worry too much - different things 0 1 0 0  Trouble relaxing 0 0 0 0  Restless 0 0 0 0  Easily annoyed or irritable 0 1 0 0  Afraid - awful might happen 0 0 0 0  Total GAD 7 Score 0 4 0 0  Anxiety Difficulty Not difficult at all Not difficult at all  Not difficult at all       11/01/2022    3:30 PM 11/12/2021    3:17 PM 04/15/2020    8:06 AM  Depression screen PHQ 2/9  Decreased Interest 0 1 0  Down, Depressed, Hopeless 0 1 0  PHQ - 2 Score 0 2 0  Altered sleeping 0 0 1  Tired, decreased energy 0 1 0  Change in appetite 0 0 0  Feeling bad or failure about yourself  0 0 0  Trouble concentrating 0 1 0  Moving slowly or fidgety/restless 0 0 0  Suicidal thoughts 0 0 0  PHQ-9 Score 0 4 1  Difficult doing work/chores Not difficult at all Somewhat difficult Not  difficult at all    BP Readings from Last 3 Encounters:  11/30/22 120/78  11/24/22 134/87  11/24/22 (!) 143/103    Physical Exam Vitals and nursing note reviewed. Exam conducted with a chaperone present.  Constitutional:      General: She is not in acute distress.    Appearance: She is not diaphoretic.  HENT:     Head: Normocephalic and atraumatic.     Right Ear: Tympanic membrane, ear canal and external ear normal. There is no impacted cerumen.     Left Ear: Tympanic membrane, ear canal and external ear normal. There is no impacted cerumen.     Nose: Nose  normal. No congestion or rhinorrhea.     Mouth/Throat:     Mouth: Mucous membranes are moist.     Pharynx: No oropharyngeal exudate or posterior oropharyngeal erythema.  Eyes:     General:        Right eye: No discharge.        Left eye: No discharge.     Conjunctiva/sclera: Conjunctivae normal.     Pupils: Pupils are equal, round, and reactive to light.  Neck:     Thyroid: No thyromegaly.     Vascular: No JVD.  Cardiovascular:     Rate and Rhythm: Normal rate and regular rhythm.     Heart sounds: Normal heart sounds. No murmur heard.    No friction rub. No gallop.  Pulmonary:     Effort: Pulmonary effort is normal.     Breath sounds: Normal breath sounds. No wheezing, rhonchi or rales.  Abdominal:     General: Bowel sounds are normal.     Palpations: Abdomen is soft. There is no mass.     Tenderness: There is no abdominal tenderness. There is no guarding.  Musculoskeletal:        General: Normal range of motion.     Cervical back: Normal range of motion and neck supple.  Lymphadenopathy:     Cervical: No cervical adenopathy.  Skin:    General: Skin is warm and dry.  Neurological:     Mental Status: She is alert.     Deep Tendon Reflexes: Reflexes are normal and symmetric.     Wt Readings from Last 3 Encounters:  11/30/22 268 lb (121.6 kg)  11/24/22 269 lb 6.4 oz (122.2 kg)  11/01/22 270 lb (122.5 kg)     BP 120/78   Pulse 82   Ht 5\' 4"  (1.626 m)   Wt 268 lb (121.6 kg)   LMP  (LMP Unknown)   SpO2 97%   BMI 46.00 kg/m   Assessment and Plan: 1. Postmenopause Chronic.  Persistent.  Stable.  Patient has overall bone discomfort which she is wondering if she is osteopenic/osteoporotic.  We will check bone density to see if there is any compression concerns due to decreased density that may be thereof. - DG Bone Density  2. Vitamin B12 deficiency Followed by hematology and is currently getting supplemental B12.  Patient does have a macrocytic appearing anemia but this is followed by hematology.  3. H/O: upper GI bleed Patient has a history of a bleeding gastric ulcer but last endoscopy was unremarkable.  However patient is being treated for an iron deficiency by hematology and recently had infusion on 11/24/2022.  We will send Hemoccult cards with patient to see if over the course of the week there is any detectable blood.  In the meantime patient is vaginal to watch for melena like concerns that may indicate bleeding of an upper GI in nature if not gastric perhaps small intestinal.  I understand that per history from patient that the last iron was in normal range but low normal (serum iron 45 compared to seventeen 1 month previous).    Elizabeth Sauer, MD

## 2022-12-23 ENCOUNTER — Inpatient Hospital Stay: Admission: RE | Admit: 2022-12-23 | Payer: Federal, State, Local not specified - PPO | Source: Ambulatory Visit

## 2022-12-24 ENCOUNTER — Inpatient Hospital Stay: Payer: Federal, State, Local not specified - PPO | Attending: Oncology

## 2022-12-27 ENCOUNTER — Emergency Department
Admission: EM | Admit: 2022-12-27 | Discharge: 2022-12-27 | Disposition: A | Discharge status: Home / Self Care | Payer: Federal, State, Local not specified - PPO | Attending: Emergency Medicine | Admitting: Emergency Medicine

## 2022-12-27 ENCOUNTER — Encounter: Payer: Self-pay | Admitting: Emergency Medicine

## 2022-12-27 ENCOUNTER — Other Ambulatory Visit: Payer: Self-pay

## 2022-12-27 ENCOUNTER — Telehealth: Payer: Self-pay

## 2022-12-27 DIAGNOSIS — S0181XA Laceration without foreign body of other part of head, initial encounter: Secondary | ICD-10-CM | POA: Insufficient documentation

## 2022-12-27 DIAGNOSIS — W228XXA Striking against or struck by other objects, initial encounter: Secondary | ICD-10-CM | POA: Diagnosis not present

## 2022-12-27 DIAGNOSIS — S0990XA Unspecified injury of head, initial encounter: Secondary | ICD-10-CM | POA: Diagnosis not present

## 2022-12-27 NOTE — Transitions of Care (Post Inpatient/ED Visit) (Signed)
   12/27/2022  Name: Catherine Page MRN: [id number] DOB: 08-23-1960  Today's TOC FU Call Status: Today's TOC FU Call Status:: Unsuccessful Call (1st Attempt) Unsuccessful Call (1st Attempt) Date: 12/27/22  Attempted to reach the patient regarding the most recent Inpatient/ED visit.  Follow Up Plan: Additional outreach attempts will be made to reach the patient to complete the Transitions of Care (Post Inpatient/ED visit) call.   Signature Arthur Holms

## 2022-12-27 NOTE — ED Triage Notes (Signed)
Patient to ED via POV for laceration to forehead. Pt states she was on her knees getting her phone that had dropped between her bed and drawers. States she hit her head on the knob on dresser. Bleeding controlled at this time. Denies LOC or blood thinners.

## 2022-12-27 NOTE — Discharge Instructions (Signed)
Your skin glue should fall off on its own in about a week.  Avoid submerging your head in water or placing oily substances such as Vaseline or Neosporin on top.  Return to the ER for new or worsening symptoms.

## 2022-12-27 NOTE — ED Provider Notes (Signed)
[facility] Provider Note    Event Date/Time   First MD Initiated Contact with Patient 12/27/22 [phone]     (approximate)   History   Laceration   HPI  Catherine Page is a 62 y.o. female presenting to the emergency department today for evaluation of forehead laceration.  Patient dropped her phone near her bed and she went to grab it when she landed her knee in the wrong spot causing her head to strike a knob on her dresser.  No LOC.  Not on anticoagulation.  Tetanus vaccine updated 3 years ago.  Did note a laceration over her forehead leading to ER presentation.  No numbness, tingling, focal weakness.  Denies injury to other areas.     Physical Exam   Triage Vital Signs: ED Triage Vitals [12/27/22 0748]  Encounter Vitals Group     BP (!) 168/92     Systolic BP Percentile      Diastolic BP Percentile      Pulse Rate 84     Resp 18     Temp 98.3 F (36.8 C)     Temp Source Oral     SpO2 100 %     Weight 267 lb (121.1 kg)     Height 5\' 5"  (1.651 m)     Head Circumference      Peak Flow      Pain Score 5     Pain Loc      Pain Education      Exclude from Growth Chart     Most recent vital signs: Vitals:   12/27/22 0748  BP: (!) 168/92  Pulse: 84  Resp: 18  Temp: 98.3 F (36.8 C)  SpO2: 100%     General: Awake, interactive  Head:  There is a 2.5 cm curvilinear laceration over the anterior forehead.  There is a minimal amount of associated gaping without any active bleeding.  Head otherwise atraumatic  Neck:  No midline pain, freely ranging neck without difficulty CV:  Regular rate, good peripheral perfusion.  Resp:  Lungs clear, unlabored respirations.  Abd:  Soft, nondistended.  Neuro:  Symmetric facial movement, fluid speech, moves extremities spontaneously and equally   ED Results / Procedures / Treatments   Labs (all labs ordered are listed, but only abnormal results are displayed) Labs Reviewed - No data to display   EKG EKG  independently reviewed interpreted by myself (ER attending) demonstrates:    RADIOLOGY Imaging independently reviewed and interpreted by myself demonstrates:    PROCEDURES:  Critical Care performed: No  ..Laceration Repair  Date/Time: 12/27/2022 8:15 AM  Performed by: Trinna Post, MD Authorized by: Trinna Post, MD   Consent:    Consent obtained:  Verbal   Consent given by:  Patient   Risks, benefits, and alternatives were discussed: yes   Anesthesia:    Anesthesia method:  None Laceration details:    Location:  Face   Face location:  Forehead   Length (cm):  2.5 Treatment:    Area cleansed with:  Saline   Visualized foreign bodies/material removed: no   Skin repair:    Repair method:  Tissue adhesive Repair type:    Repair type:  Simple Post-procedure details:    Procedure completion:  Tolerated    MEDICATIONS ORDERED IN ED: Medications - No data to display   IMPRESSION / MDM / ASSESSMENT AND PLAN / ED COURSE  I reviewed the triage vital signs and the nursing notes.  Differential diagnosis includes, but is not limited to, simple laceration, low suspicion for intracranial bleed or spine fracture based on exam, no indication for imaging  Patient's presentation is most consistent with acute, uncomplicated illness.  62 year old female presenting with forehead laceration.  Small amount of associated gaping, bleeding controlled.  Wound was washed out and closed with skin glue as above.  Patient comfortable discharge home.  Strict return precautions provided.      FINAL CLINICAL IMPRESSION(S) / ED DIAGNOSES   Final diagnoses:  Laceration of forehead, initial encounter     Rx / DC Orders   ED Discharge Orders     None        Note:  This document was prepared using Dragon voice recognition software and may include unintentional dictation errors.   Trinna Post, MD 12/27/22 [phone]

## 2022-12-28 ENCOUNTER — Telehealth: Payer: Self-pay

## 2022-12-28 NOTE — Transitions of Care (Post Inpatient/ED Visit) (Signed)
   12/28/2022  Name: Catherine Page MRN: [id number] DOB: May 08, 1960  Today's TOC FU Call Status: Today's TOC FU Call Status:: Unsuccessful Call (2nd Attempt) Unsuccessful Call (2nd Attempt) Date: 12/28/22  Attempted to reach the patient regarding the most recent Inpatient/ED visit.  Follow Up Plan: Additional outreach attempts will be made to reach the patient to complete the Transitions of Care (Post Inpatient/ED visit) call.   Signature Arthur Holms

## 2022-12-29 ENCOUNTER — Telehealth: Payer: Self-pay

## 2022-12-29 NOTE — Transitions of Care (Post Inpatient/ED Visit) (Signed)
   12/29/2022  Name: Catherine Page MRN: [id number] DOB: 1960/12/25  Today's TOC FU Call Status: Today's TOC FU Call Status:: Unsuccessful Call (3rd Attempt) Unsuccessful Call (3rd Attempt) Date: 12/28/22  Attempted to reach the patient regarding the most recent Inpatient/ED visit.  Follow Up Plan: No further outreach attempts will be made at this time. We have been unable to contact the patient.  Signature

## 2022-12-30 ENCOUNTER — Inpatient Hospital Stay: Payer: Federal, State, Local not specified - PPO

## 2023-01-04 ENCOUNTER — Inpatient Hospital Stay: Admission: RE | Admit: 2023-01-04 | Payer: Federal, State, Local not specified - PPO | Source: Ambulatory Visit

## 2023-01-24 ENCOUNTER — Inpatient Hospital Stay: Payer: Federal, State, Local not specified - PPO | Attending: Oncology

## 2023-01-27 ENCOUNTER — Ambulatory Visit: Payer: Federal, State, Local not specified - PPO | Admitting: Family Medicine

## 2023-02-09 DIAGNOSIS — F5104 Psychophysiologic insomnia: Secondary | ICD-10-CM | POA: Diagnosis not present

## 2023-02-09 DIAGNOSIS — F332 Major depressive disorder, recurrent severe without psychotic features: Secondary | ICD-10-CM | POA: Diagnosis not present

## 2023-02-09 DIAGNOSIS — F900 Attention-deficit hyperactivity disorder, predominantly inattentive type: Secondary | ICD-10-CM | POA: Diagnosis not present

## 2023-02-09 DIAGNOSIS — F410 Panic disorder [episodic paroxysmal anxiety] without agoraphobia: Secondary | ICD-10-CM | POA: Diagnosis not present

## 2023-02-15 ENCOUNTER — Telehealth: Payer: Self-pay | Admitting: Oncology

## 2023-02-15 NOTE — Telephone Encounter (Signed)
Pt called and stated she is not feeling well and missed a few B12 appts due to the passing of her brother  Pt wanted to move lab appt up -lab appt has been moved to tomorrow.   I told pt I would let Dr.Yu know the lab has been moved up and if MD wants the other appts to be moved up then we could do that as well but as of right now her MD/infusion/injection are still scheduled for 10/30

## 2023-02-16 ENCOUNTER — Inpatient Hospital Stay: Payer: Federal, State, Local not specified - PPO | Attending: Oncology

## 2023-02-16 DIAGNOSIS — D509 Iron deficiency anemia, unspecified: Secondary | ICD-10-CM | POA: Insufficient documentation

## 2023-02-16 DIAGNOSIS — E538 Deficiency of other specified B group vitamins: Secondary | ICD-10-CM | POA: Diagnosis not present

## 2023-02-16 DIAGNOSIS — D500 Iron deficiency anemia secondary to blood loss (chronic): Secondary | ICD-10-CM

## 2023-02-16 LAB — FOLATE: Folate: 34 ng/mL (ref >5.9)

## 2023-02-16 LAB — CBC WITH DIFFERENTIAL (CANCER CENTER ONLY)
Abs Immature Granulocytes: 0.02 10*3/uL (ref 0.00–0.07)
Basophils Absolute: 0 10*3/uL (ref 0.0–0.1)
Basophils Relative: 1 %
Eosinophils Absolute: 0.2 10*3/uL (ref 0.0–0.5)
Eosinophils Relative: 3 %
HCT: 40.3 % (ref 36.0–46.0)
Hemoglobin: 12.6 g/dL (ref 12.0–15.0)
Immature Granulocytes: 0 %
Lymphocytes Relative: 30 %
Lymphs Abs: 2 10*3/uL (ref 0.7–4.0)
MCH: 27.9 pg (ref 26.0–34.0)
MCHC: 31.3 g/dL (ref 30.0–36.0)
MCV: 89.4 fL (ref 80.0–100.0)
Monocytes Absolute: 0.6 10*3/uL (ref 0.1–1.0)
Monocytes Relative: 9 %
Neutro Abs: 3.8 10*3/uL (ref 1.7–7.7)
Neutrophils Relative %: 57 %
Platelet Count: 305 10*3/uL (ref 150–400)
RBC: 4.51 MIL/uL (ref 3.87–5.11)
RDW: 18.8 % — ABNORMAL HIGH (ref 11.5–15.5)
WBC Count: 6.5 10*3/uL (ref 4.0–10.5)
nRBC: 0 % (ref 0.0–0.2)

## 2023-02-16 LAB — IRON AND TIBC
Iron: 81 ug/dL (ref 28–170)
Saturation Ratios: 20 % (ref 10.4–31.8)
TIBC: 406 ug/dL (ref 250–450)
UIBC: 325 ug/dL

## 2023-02-16 LAB — FERRITIN: Ferritin: 23 ng/mL (ref 11–307)

## 2023-02-16 LAB — VITAMIN B12: Vitamin B-12: 274 pg/mL (ref 180–914)

## 2023-02-18 ENCOUNTER — Encounter: Payer: Self-pay | Admitting: Oncology

## 2023-02-18 ENCOUNTER — Inpatient Hospital Stay (HOSPITAL_BASED_OUTPATIENT_CLINIC_OR_DEPARTMENT_OTHER): Payer: Federal, State, Local not specified - PPO | Admitting: Oncology

## 2023-02-18 ENCOUNTER — Inpatient Hospital Stay: Payer: Federal, State, Local not specified - PPO

## 2023-02-18 VITALS — BP 155/93 | HR 87 | Temp 98.6°F | Resp 17 | Wt 268.8 lb

## 2023-02-18 VITALS — BP 160/93 | HR 85 | Resp 18

## 2023-02-18 DIAGNOSIS — E538 Deficiency of other specified B group vitamins: Secondary | ICD-10-CM | POA: Diagnosis not present

## 2023-02-18 DIAGNOSIS — D500 Iron deficiency anemia secondary to blood loss (chronic): Secondary | ICD-10-CM | POA: Diagnosis not present

## 2023-02-18 DIAGNOSIS — D509 Iron deficiency anemia, unspecified: Secondary | ICD-10-CM | POA: Diagnosis not present

## 2023-02-18 MED ORDER — SODIUM CHLORIDE 0.9 % IV SOLN
200.0000 mg | Freq: Once | INTRAVENOUS | Status: AC
  Administered 2023-02-18: 200 mg via INTRAVENOUS
  Filled 2023-02-18: qty 200

## 2023-02-18 MED ORDER — SODIUM CHLORIDE 0.9 % IV SOLN
INTRAVENOUS | Status: DC
  Filled 2023-02-18: qty 250

## 2023-02-18 MED ORDER — CYANOCOBALAMIN 1000 MCG/ML IJ SOLN
1000.0000 ug | Freq: Once | INTRAMUSCULAR | Status: AC
  Administered 2023-02-18: 1000 ug via INTRAMUSCULAR
  Filled 2023-02-18: qty 1

## 2023-02-18 NOTE — Assessment & Plan Note (Addendum)
Folate level has normalized. She may stop folic acid supplementation for 1-2 months and resume supplementation 1-2 times per week for maintenance.

## 2023-02-18 NOTE — Assessment & Plan Note (Addendum)
Recommend B12 1000 mcg injection  monthly.

## 2023-02-18 NOTE — Progress Notes (Signed)
Hematology/Oncology Progress note [phone] [phone]         Patient Care Team: Duanne Limerick, MD as PCP - General (Family Medicine) Rickard Patience, MD as Consulting Physician (Oncology)   REFERRING PROVIDER: Duanne Limerick, MD  CHIEF COMPLAINTS/REASON FOR VISIT:  Anemia  ASSESSMENT & PLAN:  Iron deficiency anemia Labs are reviewed and discussed with patient. Lab Results  Component Value Date   HGB 12.6 02/16/2023   TIBC 406 02/16/2023   IRONPCTSAT 20 02/16/2023   FERRITIN 23 02/16/2023    Recommend 1 dose of Venofer for maintenance    Folate deficiency Folate level has normalized. She may stop folic acid supplementation for 1-2 months and resume supplementation 1-2 times per week for maintenance.   Vitamin B12 deficiency Recommend B12 1000 mcg injection  monthly.   Orders Placed This Encounter  Procedures   CBC with Differential (Cancer Center Only)    Standing Status:   Future    Standing Expiration Date:   02/18/2024   Iron and TIBC    Standing Status:   Future    Standing Expiration Date:   02/18/2024   Ferritin    Standing Status:   Future    Standing Expiration Date:   02/18/2024   Vitamin B12    Standing Status:   Future    Standing Expiration Date:   02/18/2024   Folate    Standing Status:   Future    Standing Expiration Date:   02/18/2024   Follow up in 3 months.  All questions were answered. The patient knows to call the clinic with any problems, questions or concerns.  Rickard Patience, MD, PhD [facility] Hematology Oncology 02/18/2023     HISTORY OF PRESENTING ILLNESS:  Catherine Page is a  62 y.o.  female with PMH listed below who was referred to me for anemia Reviewed patient's recent labs that was done.  She was found to have abnormal CBC on 11/12/2021 hemoglobin 7.8, down from hemoglobin 11 in 2021.  Ferritin was decreased at 6. 11/27/2021 with a hemoglobin of 8.1, MCV 71.5, platelet count 504,000, white count 17.9.   Primarily neutrophilia with an absolute ANC of 9.2.  Increased absolute eosinophilia Reviewed patient's previous labs ordered by primary care physician's office, anemia is chronic onset , duration is since  No aggravating or improving factors. + Fatigue, shortness of breath with exertion, lightheadedness. She had not noticed any recent bleeding such as epistaxis, hematuria or hematochezia.  Patient used to take Goody's or ibuprofen chronically.  Currently not using. She denies any pica and eats a variety of diet. Patient has history of Nissen fundoplication surgery in 1995.  She also has a history of EGD dilatation in the distant past.  She takes pantoprazole 40 mg daily for acid reflux.  Symptoms are improved with PPI. Patient has started on oral iron supplementation. 10/18/2022 - 10/21/2022, patient was admitted due to acute upper GI bleeding.  Patient was treated with IV PPI, 1 unit of PRBC transfusion, 3 doses of IV Venofer treatments during admission. 6/18-EGD with no clear bleeding source recognized   INTERVAL HISTORY Catherine Page is a 62 y.o. female who has above history reviewed by me today presents for follow up visit for anemia. + chronic fatigue.  She missed B12 injections due to loss of family members   MEDICAL HISTORY:  Past Medical History:  Diagnosis Date   Asthma    Depression    Insomnia     SURGICAL HISTORY: Past  Surgical History:  Procedure Laterality Date   BIOPSY  10/19/2022   Procedure: BIOPSY;  Surgeon: Midge Minium, MD;  Location: [facility] ENDOSCOPY;  Service: Endoscopy;;   COLONOSCOPY     COLONOSCOPY N/A 04/15/2022   Procedure: COLONOSCOPY;  Surgeon: Jaynie Collins, DO;  Location: [facility] ENDOSCOPY;  Service: Gastroenterology;  Laterality: N/A;   ESOPHAGOGASTRODUODENOSCOPY     ESOPHAGOGASTRODUODENOSCOPY N/A 04/15/2022   Procedure: ESOPHAGOGASTRODUODENOSCOPY (EGD);  Surgeon: Jaynie Collins, DO;  Location: [facility] ENDOSCOPY;  Service: Gastroenterology;   Laterality: N/A;   ESOPHAGOGASTRODUODENOSCOPY (EGD) WITH PROPOFOL N/A 10/19/2022   Procedure: ESOPHAGOGASTRODUODENOSCOPY (EGD) WITH PROPOFOL;  Surgeon: Midge Minium, MD;  Location: [facility] ENDOSCOPY;  Service: Endoscopy;  Laterality: N/A;   FOOT SURGERY     NISSEN FUNDOPLICATION N/A 1995   SHOULDER SURGERY Left 05/03/2018    SOCIAL HISTORY: Social History   Socioeconomic History   Marital status: Single    Spouse name: Not on file   Number of children: Not on file   Years of education: Not on file   Highest education level: Bachelor's degree (e.g., BA, AB, BS)  Occupational History   Not on file  Tobacco Use   Smoking status: Never   Smokeless tobacco: Never  Vaping Use   Vaping status: Never Used  Substance and Sexual Activity   Alcohol use: Not Currently    Comment: rarely   Drug use: Never   Sexual activity: Not Currently  Other Topics Concern   Not on file  Social History Narrative   Not on file   Social Determinants of Health   Financial Resource Strain: Low Risk  (11/30/2022)   Overall Financial Resource Strain (CARDIA)    Difficulty of Paying Living Expenses: Not hard at all  Food Insecurity: No Food Insecurity (11/30/2022)   Hunger Vital Sign    Worried About Running Out of Food in the Last Year: Never true    Ran Out of Food in the Last Year: Never true  Transportation Needs: No Transportation Needs (11/30/2022)   PRAPARE - Administrator, Civil Service (Medical): No    Lack of Transportation (Non-Medical): No  Physical Activity: Unknown (11/30/2022)   Exercise Vital Sign    Days of Exercise per Week: 0 days    Minutes of Exercise per Session: Not on file  Stress: No Stress Concern Present (11/30/2022)   [employer] of Occupational Health - Occupational Stress Questionnaire    Feeling of Stress : Not at all  Social Connections: Socially Isolated (11/30/2022)   Social Connection and Isolation Panel [NHANES]    Frequency of Communication with  Friends and Family: Three times a week    Frequency of Social Gatherings with Friends and Family: Once a week    Attends Religious Services: Never    Database administrator or Organizations: No    Attends Engineer, structural: Not on file    Marital Status: Divorced  Intimate Partner Violence: Not At Risk (10/18/2022)   Humiliation, Afraid, Rape, and Kick questionnaire    Fear of Current or Ex-Partner: No    Emotionally Abused: No    Physically Abused: No    Sexually Abused: No    FAMILY HISTORY: Family History  Problem Relation Age of Onset   Alzheimer's disease Mother    Dementia Mother    Hypertension Mother    Diabetes Mother    Stroke Mother    Other Father 76       committed suicide   Heart attack  Father    Heart disease Father    Stroke Father    Cancer Brother    Breast cancer Neg Hx     ALLERGIES:  has No Known Allergies.  MEDICATIONS:  Current Outpatient Medications  Medication Sig Dispense Refill   ALPRAZolam (XANAX) 1 MG tablet Take 1 mg by mouth 2 (two) times daily as needed.     amphetamine-dextroamphetamine (ADDERALL) 20 MG tablet Take 20 mg by mouth 2 (two) times daily. CBC     ARIPiprazole (ABILIFY) 2 MG tablet Take 2 mg by mouth daily. CBC     busPIRone (BUSPAR) 15 MG tablet Take 15 mg by mouth 2 (two) times daily.     desvenlafaxine (PRISTIQ) 100 MG 24 hr tablet Take 100 mg by mouth daily. CBC     folic acid (FOLVITE) 800 MCG tablet Take 1 tablet (800 mcg total) by mouth daily. 90 tablet 2   zolpidem (AMBIEN CR) 12.5 MG CR tablet Take 12.5 mg by mouth at bedtime.     clarithromycin (BIAXIN) 500 MG tablet Take 500 mg by mouth 2 (two) times daily.     Fluticasone-Umeclidin-Vilant (TRELEGY ELLIPTA) 200-62.5-25 MCG/ACT AEPB Inhale 1 puff into the lungs daily.     pantoprazole (PROTONIX) 40 MG tablet TAKE 1 TABLET(40 MG) BY MOUTH DAILY 90 tablet 0   No current facility-administered medications for this visit.    Review of Systems   Constitutional:  Positive for fatigue. Negative for appetite change, chills and fever.  HENT:   Negative for hearing loss and voice change.   Eyes:  Negative for eye problems.  Respiratory:  Negative for chest tightness, cough and shortness of breath.   Cardiovascular:  Negative for chest pain.  Gastrointestinal:  Negative for abdominal distention, abdominal pain and blood in stool.  Endocrine: Negative for hot flashes.  Genitourinary:  Negative for difficulty urinating and frequency.   Musculoskeletal:  Negative for arthralgias.  Skin:  Negative for itching and rash.  Neurological:  Negative for extremity weakness and light-headedness.  Hematological:  Negative for adenopathy.  Psychiatric/Behavioral:  Negative for confusion.      PHYSICAL EXAMINATION: ECOG PERFORMANCE STATUS: 1 - Symptomatic but completely ambulatory Vitals:   02/18/23 1123  BP: (!) 155/93  Pulse: 87  Resp: 17  Temp: 98.6 F (37 C)  SpO2: 96%   Filed Weights   02/18/23 1123  Weight: 268 lb 12.8 oz (121.9 kg)    Physical Exam Constitutional:      General: She is not in acute distress.    Appearance: She is obese.  HENT:     Head: Normocephalic and atraumatic.  Eyes:     General: No scleral icterus. Cardiovascular:     Rate and Rhythm: Normal rate and regular rhythm.  Pulmonary:     Effort: Pulmonary effort is normal. No respiratory distress.     Breath sounds: No wheezing.  Abdominal:     General: Bowel sounds are normal. There is no distension.     Palpations: Abdomen is soft.  Musculoskeletal:        General: No deformity. Normal range of motion.     Cervical back: Normal range of motion and neck supple.  Skin:    General: Skin is warm and dry.     Coloration: Skin is not pale.  Neurological:     Mental Status: She is alert and oriented to person, place, and time. Mental status is at baseline.  Psychiatric:        Mood and  Affect: Mood normal.      LABORATORY DATA:  I have reviewed  the data as listed    Latest Ref Rng & Units 02/16/2023    1:21 PM 11/22/2022    3:02 PM 11/01/2022    4:19 PM  CBC  WBC 4.0 - 10.5 K/uL 6.5  6.1  8.3   Hemoglobin 12.0 - 15.0 g/dL 16.1  09.6  04.5   Hematocrit 36.0 - 46.0 % 40.3  35.0  31.9   Platelets 150 - 400 K/uL 305  368  361       Latest Ref Rng & Units 10/19/2022    6:07 AM 10/18/2022    1:22 PM 11/27/2021    6:38 AM  CMP  Glucose 70 - 99 mg/dL 97  98  409   BUN 6 - 20 mg/dL 12  14  14    Creatinine 0.44 - 1.00 mg/dL 8.11  9.14  7.82   Sodium 135 - 145 mmol/L 135  135  141   Potassium 3.5 - 5.1 mmol/L 3.7  3.5  3.5   Chloride 98 - 111 mmol/L 103  101  105   CO2 22 - 32 mmol/L 23  24  26    Calcium 8.9 - 10.3 mg/dL 8.2  8.6  8.8   Total Protein 6.5 - 8.1 g/dL  7.1  7.1   Total Bilirubin 0.3 - 1.2 mg/dL  0.5  0.4   Alkaline Phos 38 - 126 U/L  69  140   AST 15 - 41 U/L  40  19   ALT 0 - 44 U/L  29  12       Component Value Date/Time   IRON 81 02/16/2023 1321   TIBC 406 02/16/2023 1321   FERRITIN 23 02/16/2023 1321   FERRITIN 6 (L) 11/12/2021 1616   FERRITIN 10 04/29/2020 0000   IRONPCTSAT 20 02/16/2023 1321     RADIOGRAPHIC STUDIES: I have personally reviewed the radiological images as listed and agreed with the findings in the report. No results found.

## 2023-02-18 NOTE — Assessment & Plan Note (Addendum)
Labs are reviewed and discussed with patient. Lab Results  Component Value Date   HGB 12.6 02/16/2023   TIBC 406 02/16/2023   IRONPCTSAT 20 02/16/2023   FERRITIN 23 02/16/2023    Recommend 1 dose of Venofer for maintenance

## 2023-02-18 NOTE — Progress Notes (Signed)
Patient states she took a b12 supplement the morning of her lab work. She states she didn't know she was going to be getting blood drawn for b12 and folic.

## 2023-02-23 ENCOUNTER — Inpatient Hospital Stay: Payer: Federal, State, Local not specified - PPO

## 2023-02-28 ENCOUNTER — Other Ambulatory Visit: Payer: Federal, State, Local not specified - PPO

## 2023-03-02 ENCOUNTER — Ambulatory Visit: Payer: Federal, State, Local not specified - PPO | Admitting: Oncology

## 2023-03-02 ENCOUNTER — Ambulatory Visit: Payer: Federal, State, Local not specified - PPO

## 2023-03-30 ENCOUNTER — Other Ambulatory Visit: Payer: Federal, State, Local not specified - PPO

## 2023-04-04 ENCOUNTER — Ambulatory Visit: Payer: Federal, State, Local not specified - PPO

## 2023-04-04 ENCOUNTER — Ambulatory Visit: Payer: Federal, State, Local not specified - PPO | Admitting: Oncology

## 2023-05-11 DIAGNOSIS — F5104 Psychophysiologic insomnia: Secondary | ICD-10-CM | POA: Diagnosis not present

## 2023-05-11 DIAGNOSIS — F410 Panic disorder [episodic paroxysmal anxiety] without agoraphobia: Secondary | ICD-10-CM | POA: Diagnosis not present

## 2023-05-11 DIAGNOSIS — F900 Attention-deficit hyperactivity disorder, predominantly inattentive type: Secondary | ICD-10-CM | POA: Diagnosis not present

## 2023-05-11 DIAGNOSIS — F331 Major depressive disorder, recurrent, moderate: Secondary | ICD-10-CM | POA: Diagnosis not present

## 2023-07-04 DIAGNOSIS — J3489 Other specified disorders of nose and nasal sinuses: Secondary | ICD-10-CM | POA: Diagnosis not present

## 2023-07-04 DIAGNOSIS — J4520 Mild intermittent asthma, uncomplicated: Secondary | ICD-10-CM | POA: Diagnosis not present

## 2023-07-04 DIAGNOSIS — J302 Other seasonal allergic rhinitis: Secondary | ICD-10-CM | POA: Diagnosis not present

## 2023-07-04 DIAGNOSIS — J342 Deviated nasal septum: Secondary | ICD-10-CM | POA: Diagnosis not present

## 2023-07-08 ENCOUNTER — Encounter: Payer: Federal, State, Local not specified - PPO | Admitting: Family Medicine

## 2023-07-15 DIAGNOSIS — J302 Other seasonal allergic rhinitis: Secondary | ICD-10-CM | POA: Diagnosis not present

## 2023-07-21 ENCOUNTER — Ambulatory Visit: Admission: RE | Admit: 2023-07-21 | Source: Ambulatory Visit

## 2023-07-26 ENCOUNTER — Telehealth: Payer: Self-pay | Admitting: Oncology

## 2023-07-26 NOTE — Telephone Encounter (Signed)
 Patient called to reschedule lab appointment to 1 day prior- appointment rescheduled as requested

## 2023-08-01 DIAGNOSIS — F900 Attention-deficit hyperactivity disorder, predominantly inattentive type: Secondary | ICD-10-CM | POA: Diagnosis not present

## 2023-08-01 DIAGNOSIS — F332 Major depressive disorder, recurrent severe without psychotic features: Secondary | ICD-10-CM | POA: Diagnosis not present

## 2023-08-01 DIAGNOSIS — F410 Panic disorder [episodic paroxysmal anxiety] without agoraphobia: Secondary | ICD-10-CM | POA: Diagnosis not present

## 2023-08-01 DIAGNOSIS — F5104 Psychophysiologic insomnia: Secondary | ICD-10-CM | POA: Diagnosis not present

## 2023-08-16 ENCOUNTER — Inpatient Hospital Stay: Attending: Oncology

## 2023-08-16 DIAGNOSIS — D509 Iron deficiency anemia, unspecified: Secondary | ICD-10-CM | POA: Insufficient documentation

## 2023-08-16 DIAGNOSIS — E538 Deficiency of other specified B group vitamins: Secondary | ICD-10-CM | POA: Insufficient documentation

## 2023-08-17 ENCOUNTER — Other Ambulatory Visit: Payer: Federal, State, Local not specified - PPO

## 2023-08-19 ENCOUNTER — Inpatient Hospital Stay: Payer: Federal, State, Local not specified - PPO

## 2023-08-19 ENCOUNTER — Inpatient Hospital Stay: Payer: Federal, State, Local not specified - PPO | Admitting: Oncology

## 2023-08-30 ENCOUNTER — Inpatient Hospital Stay

## 2023-08-30 DIAGNOSIS — E538 Deficiency of other specified B group vitamins: Secondary | ICD-10-CM | POA: Diagnosis not present

## 2023-08-30 DIAGNOSIS — D509 Iron deficiency anemia, unspecified: Secondary | ICD-10-CM | POA: Diagnosis not present

## 2023-08-30 DIAGNOSIS — D500 Iron deficiency anemia secondary to blood loss (chronic): Secondary | ICD-10-CM

## 2023-08-30 LAB — CBC WITH DIFFERENTIAL (CANCER CENTER ONLY)
Abs Immature Granulocytes: 0.02 10*3/uL (ref 0.00–0.07)
Basophils Absolute: 0.1 10*3/uL (ref 0.0–0.1)
Basophils Relative: 1 %
Eosinophils Absolute: 0.2 10*3/uL (ref 0.0–0.5)
Eosinophils Relative: 3 %
HCT: 42.3 % (ref 36.0–46.0)
Hemoglobin: 14.1 g/dL (ref 12.0–15.0)
Immature Granulocytes: 0 %
Lymphocytes Relative: 45 %
Lymphs Abs: 3.4 10*3/uL (ref 0.7–4.0)
MCH: 32.2 pg (ref 26.0–34.0)
MCHC: 33.3 g/dL (ref 30.0–36.0)
MCV: 96.6 fL (ref 80.0–100.0)
Monocytes Absolute: 0.7 10*3/uL (ref 0.1–1.0)
Monocytes Relative: 10 %
Neutro Abs: 3 10*3/uL (ref 1.7–7.7)
Neutrophils Relative %: 41 %
Platelet Count: 282 10*3/uL (ref 150–400)
RBC: 4.38 MIL/uL (ref 3.87–5.11)
RDW: 14.3 % (ref 11.5–15.5)
WBC Count: 7.3 10*3/uL (ref 4.0–10.5)
nRBC: 0 % (ref 0.0–0.2)

## 2023-08-30 LAB — IRON AND TIBC
Iron: 86 ug/dL (ref 28–170)
Saturation Ratios: 21 % (ref 10.4–31.8)
TIBC: 413 ug/dL (ref 250–450)
UIBC: 327 ug/dL

## 2023-08-30 LAB — FERRITIN: Ferritin: 26 ng/mL (ref 11–307)

## 2023-08-30 LAB — VITAMIN B12: Vitamin B-12: 276 pg/mL (ref 180–914)

## 2023-08-30 LAB — FOLATE: Folate: 5.7 ng/mL — ABNORMAL LOW (ref >5.9)

## 2023-09-01 ENCOUNTER — Inpatient Hospital Stay: Admitting: Oncology

## 2023-09-01 ENCOUNTER — Inpatient Hospital Stay

## 2023-09-07 ENCOUNTER — Inpatient Hospital Stay: Admitting: Oncology

## 2023-09-07 ENCOUNTER — Inpatient Hospital Stay

## 2023-09-14 ENCOUNTER — Inpatient Hospital Stay: Attending: Oncology | Admitting: Oncology

## 2023-09-14 ENCOUNTER — Encounter: Payer: Self-pay | Admitting: Oncology

## 2023-09-14 ENCOUNTER — Inpatient Hospital Stay

## 2023-09-14 VITALS — BP 152/95 | HR 103 | Temp 96.8°F | Resp 18 | Wt 284.0 lb

## 2023-09-14 DIAGNOSIS — R0602 Shortness of breath: Secondary | ICD-10-CM | POA: Diagnosis not present

## 2023-09-14 DIAGNOSIS — D509 Iron deficiency anemia, unspecified: Secondary | ICD-10-CM | POA: Diagnosis not present

## 2023-09-14 DIAGNOSIS — E538 Deficiency of other specified B group vitamins: Secondary | ICD-10-CM | POA: Diagnosis not present

## 2023-09-14 DIAGNOSIS — D500 Iron deficiency anemia secondary to blood loss (chronic): Secondary | ICD-10-CM | POA: Diagnosis not present

## 2023-09-14 DIAGNOSIS — D508 Other iron deficiency anemias: Secondary | ICD-10-CM | POA: Diagnosis not present

## 2023-09-14 DIAGNOSIS — Z6841 Body Mass Index (BMI) 40.0 and over, adult: Secondary | ICD-10-CM | POA: Diagnosis not present

## 2023-09-14 MED ORDER — CYANOCOBALAMIN 1000 MCG/ML IJ SOLN
1000.0000 ug | Freq: Once | INTRAMUSCULAR | Status: AC
  Administered 2023-09-14: 1000 ug via INTRAMUSCULAR
  Filled 2023-09-14: qty 1

## 2023-09-14 MED ORDER — IRON SUCROSE 20 MG/ML IV SOLN
200.0000 mg | Freq: Once | INTRAVENOUS | Status: AC
  Administered 2023-09-14: 200 mg via INTRAVENOUS
  Filled 2023-09-14: qty 10

## 2023-09-14 NOTE — Assessment & Plan Note (Addendum)
 Recommend folic acid  supplementation daily

## 2023-09-14 NOTE — Progress Notes (Signed)
 Hematology/Oncology Progress note [phone] [phone]         Patient Care Team: Clarise Crooks, MD as PCP - General (Family Medicine) Timmy Forbes, MD as Consulting Physician (Oncology)   REFERRING PROVIDER: Clarise Crooks, MD  CHIEF COMPLAINTS/REASON FOR VISIT:  Anemia  ASSESSMENT & PLAN:  Iron  deficiency anemia Labs are reviewed and discussed with patient. Lab Results  Component Value Date   HGB 14.1 08/30/2023   TIBC 413 08/30/2023   IRONPCTSAT 21 08/30/2023   FERRITIN 26 08/30/2023    Recommend 1 dose of Venofer  for maintenance    Folate deficiency Recommend folic acid  supplementation daily  Vitamin B12 deficiency Recommend B12 1000 mcg injection  monthly.   Orders Placed This Encounter  Procedures   CBC with Differential (Cancer Center Only)    Standing Status:   Future    Expected Date:   03/16/2024    Expiration Date:   09/13/2024   Ferritin    Standing Status:   Future    Expected Date:   03/16/2024    Expiration Date:   09/13/2024   Vitamin B12    Standing Status:   Future    Expected Date:   03/16/2024    Expiration Date:   09/13/2024   Iron  and TIBC    Standing Status:   Future    Expected Date:   03/16/2024    Expiration Date:   09/13/2024   Follow up in 6 months.  All questions were answered. The patient knows to call the clinic with any problems, questions or concerns.  Timmy Forbes, MD, PhD [facility] Hematology Oncology 09/14/2023     HISTORY OF PRESENTING ILLNESS:  Catherine Page is a  63 y.o.  female with PMH listed below who was referred to me for anemia Reviewed patient's recent labs that was done.  She was found to have abnormal CBC on 11/12/2021 hemoglobin 7.8, down from hemoglobin 11 in 2021.  Ferritin was decreased at 6. 11/27/2021 with a hemoglobin of 8.1, MCV 71.5, platelet count 504,000, white count 17.9.  Primarily neutrophilia with an absolute ANC of 9.2.  Increased absolute eosinophilia Reviewed  patient's previous labs ordered by primary care physician's office, anemia is chronic onset , duration is since  No aggravating or improving factors. + Fatigue, shortness of breath with exertion, lightheadedness. She had not noticed any recent bleeding such as epistaxis, hematuria or hematochezia.  Patient used to take Goody's or ibuprofen  chronically.  Currently not using. She denies any pica and eats a variety of diet. Patient has history of Nissen fundoplication surgery in 1995.  She also has a history of EGD dilatation in the distant past.  She takes pantoprazole  40 mg daily for acid reflux.  Symptoms are improved with PPI. Patient has started on oral iron  supplementation. 10/18/2022 - 10/21/2022, patient was admitted due to acute upper GI bleeding.  Patient was treated with IV PPI, 1 unit of PRBC transfusion, 3 doses of IV Venofer  treatments during admission. 6/18-EGD with no clear bleeding source recognized   INTERVAL HISTORY Catherine Page is a 63 y.o. female who has above history reviewed by me today presents for follow up visit for anemia, history of B12 deficiency. + chronic fatigue.    MEDICAL HISTORY:  Past Medical History:  Diagnosis Date   Asthma    Depression    Insomnia     SURGICAL HISTORY: Past Surgical History:  Procedure Laterality Date   BIOPSY  10/19/2022   Procedure: BIOPSY;  Surgeon: Marnee Sink, MD;  Location: [facility] ENDOSCOPY;  Service: Endoscopy;;   COLONOSCOPY     COLONOSCOPY N/A 04/15/2022   Procedure: COLONOSCOPY;  Surgeon: Quintin Buckle, DO;  Location: [facility] ENDOSCOPY;  Service: Gastroenterology;  Laterality: N/A;   ESOPHAGOGASTRODUODENOSCOPY     ESOPHAGOGASTRODUODENOSCOPY N/A 04/15/2022   Procedure: ESOPHAGOGASTRODUODENOSCOPY (EGD);  Surgeon: Quintin Buckle, DO;  Location: [facility] ENDOSCOPY;  Service: Gastroenterology;  Laterality: N/A;   ESOPHAGOGASTRODUODENOSCOPY (EGD) WITH PROPOFOL  N/A 10/19/2022   Procedure: ESOPHAGOGASTRODUODENOSCOPY (EGD)  WITH PROPOFOL ;  Surgeon: Marnee Sink, MD;  Location: [facility] ENDOSCOPY;  Service: Endoscopy;  Laterality: N/A;   FOOT SURGERY     NISSEN FUNDOPLICATION N/A 1995   SHOULDER SURGERY Left 05/03/2018    SOCIAL HISTORY: Social History   Socioeconomic History   Marital status: Single    Spouse name: Not on file   Number of children: Not on file   Years of education: Not on file   Highest education level: Bachelor's degree (e.g., BA, AB, BS)  Occupational History   Not on file  Tobacco Use   Smoking status: Never   Smokeless tobacco: Never  Vaping Use   Vaping status: Never Used  Substance and Sexual Activity   Alcohol use: Not Currently    Comment: rarely   Drug use: Never   Sexual activity: Not Currently  Other Topics Concern   Not on file  Social History Narrative   Not on file   Social Drivers of Health   Financial Resource Strain: Low Risk  (09/12/2023)   Received from [facility]   Overall Financial Resource Strain (CARDIA)    Difficulty of Paying Living Expenses: Not hard at all  Food Insecurity: No Food Insecurity (09/12/2023)   Received from [facility]   Hunger Vital Sign    Worried About Running Out of Food in the Last Year: Never true    Ran Out of Food in the Last Year: Never true  Transportation Needs: No Transportation Needs (09/12/2023)   Received from [facility] - Transportation    In the past 12 months, has lack of transportation kept you from medical appointments or from getting medications?: No    Lack of Transportation (Non-Medical): No  Physical Activity: Unknown (07/04/2023)   Exercise Vital Sign    Days of Exercise per Week: 0 days    Minutes of Exercise per Session: Not on file  Stress: Stress Concern Present (07/04/2023)   [employer] of Occupational Health - Occupational Stress Questionnaire    Feeling of Stress : To some extent  Social Connections: Socially Isolated (07/04/2023)    Social Connection and Isolation Panel [NHANES]    Frequency of Communication with Friends and Family: Once a week    Frequency of Social Gatherings with Friends and Family: Never    Attends Religious Services: Never    Database administrator or Organizations: No    Attends Engineer, structural: Not on file    Marital Status: Divorced  Intimate Partner Violence: Not At Risk (10/18/2022)   Humiliation, Afraid, Rape, and Kick questionnaire    Fear of Current or Ex-Partner: No    Emotionally Abused: No    Physically Abused: No    Sexually Abused: No    FAMILY HISTORY: Family History  Problem Relation Age of Onset   Alzheimer's disease Mother    Dementia Mother    Hypertension Mother    Diabetes Mother    Stroke Mother  Other Father 21       committed suicide   Heart attack Father    Heart disease Father    Stroke Father    Cancer Brother    Breast cancer Neg Hx     ALLERGIES:  has no known allergies.  MEDICATIONS:  Current Outpatient Medications  Medication Sig Dispense Refill   ALPRAZolam  (XANAX ) 1 MG tablet Take 1 mg by mouth 2 (two) times daily as needed.     amphetamine -dextroamphetamine  (ADDERALL) 20 MG tablet Take 20 mg by mouth 2 (two) times daily. CBC     ARIPiprazole  (ABILIFY ) 2 MG tablet Take 2 mg by mouth daily. CBC     desvenlafaxine (PRISTIQ) 100 MG 24 hr tablet Take 100 mg by mouth daily. CBC     zolpidem  (AMBIEN  CR) 12.5 MG CR tablet Take 12.5 mg by mouth at bedtime.     No current facility-administered medications for this visit.    Review of Systems  Constitutional:  Positive for fatigue. Negative for appetite change, chills and fever.  HENT:   Negative for hearing loss and voice change.   Eyes:  Negative for eye problems.  Respiratory:  Negative for chest tightness, cough and shortness of breath.   Cardiovascular:  Negative for chest pain.  Gastrointestinal:  Negative for abdominal distention, abdominal pain and blood in stool.  Endocrine:  Negative for hot flashes.  Genitourinary:  Negative for difficulty urinating and frequency.   Musculoskeletal:  Negative for arthralgias.  Skin:  Negative for itching and rash.  Neurological:  Negative for extremity weakness and light-headedness.  Hematological:  Negative for adenopathy.  Psychiatric/Behavioral:  Negative for confusion.      PHYSICAL EXAMINATION: ECOG PERFORMANCE STATUS: 1 - Symptomatic but completely ambulatory Vitals:   09/14/23 1312 09/14/23 1323  BP: (!) 170/99 (!) 152/95  Pulse: (!) 103   Resp: 18   Temp: (!) 96.8 F (36 C)   SpO2: 98%    Filed Weights   09/14/23 1312  Weight: 284 lb (128.8 kg)    Physical Exam Constitutional:      General: She is not in acute distress.    Appearance: She is obese.  HENT:     Head: Normocephalic and atraumatic.  Eyes:     General: No scleral icterus. Cardiovascular:     Rate and Rhythm: Normal rate and regular rhythm.  Pulmonary:     Effort: Pulmonary effort is normal. No respiratory distress.  Abdominal:     General: There is no distension.  Musculoskeletal:        General: No deformity. Normal range of motion.     Cervical back: Normal range of motion and neck supple.  Skin:    General: Skin is warm and dry.     Coloration: Skin is not pale.  Neurological:     Mental Status: She is alert and oriented to person, place, and time. Mental status is at baseline.  Psychiatric:        Mood and Affect: Mood normal.      LABORATORY DATA:  I have reviewed the data as listed    Latest Ref Rng & Units 08/30/2023    2:57 PM 02/16/2023    1:21 PM 11/22/2022    3:02 PM  CBC  WBC 4.0 - 10.5 K/uL 7.3  6.5  6.1   Hemoglobin 12.0 - 15.0 g/dL 78.4  69.6  29.5   Hematocrit 36.0 - 46.0 % 42.3  40.3  35.0   Platelets 150 - 400  K/uL 282  305  368       Latest Ref Rng & Units 10/19/2022    6:07 AM 10/18/2022    1:22 PM 11/27/2021    6:38 AM  CMP  Glucose 70 - 99 mg/dL 97  98  161   BUN 6 - 20 mg/dL 12  14  14     Creatinine 0.44 - 1.00 mg/dL 0.96  0.45  4.09   Sodium 135 - 145 mmol/L 135  135  141   Potassium 3.5 - 5.1 mmol/L 3.7  3.5  3.5   Chloride 98 - 111 mmol/L 103  101  105   CO2 22 - 32 mmol/L 23  24  26    Calcium 8.9 - 10.3 mg/dL 8.2  8.6  8.8   Total Protein 6.5 - 8.1 g/dL  7.1  7.1   Total Bilirubin 0.3 - 1.2 mg/dL  0.5  0.4   Alkaline Phos 38 - 126 U/L  69  140   AST 15 - 41 U/L  40  19   ALT 0 - 44 U/L  29  12       Component Value Date/Time   IRON  86 08/30/2023 1457   TIBC 413 08/30/2023 1457   FERRITIN 26 08/30/2023 1457   FERRITIN 6 (L) 11/12/2021 1616   FERRITIN 10 04/29/2020 0000   IRONPCTSAT 21 08/30/2023 1457     RADIOGRAPHIC STUDIES: I have personally reviewed the radiological images as listed and agreed with the findings in the report. No results found.

## 2023-09-14 NOTE — Assessment & Plan Note (Signed)
 Labs are reviewed and discussed with patient. Lab Results  Component Value Date   HGB 14.1 08/30/2023   TIBC 413 08/30/2023   IRONPCTSAT 21 08/30/2023   FERRITIN 26 08/30/2023    Recommend 1 dose of Venofer  for maintenance

## 2023-09-14 NOTE — Assessment & Plan Note (Addendum)
Recommend B12 1000 mcg injection  monthly.

## 2023-10-17 ENCOUNTER — Inpatient Hospital Stay

## 2023-10-18 ENCOUNTER — Inpatient Hospital Stay: Attending: Oncology

## 2023-11-16 ENCOUNTER — Inpatient Hospital Stay: Attending: Oncology

## 2023-11-16 DIAGNOSIS — E538 Deficiency of other specified B group vitamins: Secondary | ICD-10-CM | POA: Diagnosis not present

## 2023-11-16 DIAGNOSIS — D500 Iron deficiency anemia secondary to blood loss (chronic): Secondary | ICD-10-CM

## 2023-11-16 MED ORDER — CYANOCOBALAMIN 1000 MCG/ML IJ SOLN
1000.0000 ug | Freq: Once | INTRAMUSCULAR | Status: AC
  Administered 2023-11-16: 1000 ug via INTRAMUSCULAR
  Filled 2023-11-16: qty 1

## 2023-11-21 DIAGNOSIS — F5104 Psychophysiologic insomnia: Secondary | ICD-10-CM | POA: Diagnosis not present

## 2023-11-21 DIAGNOSIS — F900 Attention-deficit hyperactivity disorder, predominantly inattentive type: Secondary | ICD-10-CM | POA: Diagnosis not present

## 2023-11-21 DIAGNOSIS — F332 Major depressive disorder, recurrent severe without psychotic features: Secondary | ICD-10-CM | POA: Diagnosis not present

## 2023-12-05 DIAGNOSIS — Z1331 Encounter for screening for depression: Secondary | ICD-10-CM | POA: Diagnosis not present

## 2023-12-05 DIAGNOSIS — Z0000 Encounter for general adult medical examination without abnormal findings: Secondary | ICD-10-CM | POA: Diagnosis not present

## 2023-12-05 DIAGNOSIS — D508 Other iron deficiency anemias: Secondary | ICD-10-CM | POA: Diagnosis not present

## 2023-12-05 DIAGNOSIS — R0602 Shortness of breath: Secondary | ICD-10-CM | POA: Diagnosis not present

## 2023-12-05 DIAGNOSIS — E7801 Familial hypercholesterolemia: Secondary | ICD-10-CM | POA: Diagnosis not present

## 2023-12-05 DIAGNOSIS — M25551 Pain in right hip: Secondary | ICD-10-CM | POA: Diagnosis not present

## 2023-12-06 ENCOUNTER — Other Ambulatory Visit: Payer: Self-pay | Admitting: Family Medicine

## 2023-12-06 DIAGNOSIS — Z1231 Encounter for screening mammogram for malignant neoplasm of breast: Secondary | ICD-10-CM

## 2023-12-19 ENCOUNTER — Inpatient Hospital Stay

## 2023-12-22 ENCOUNTER — Inpatient Hospital Stay: Attending: Oncology

## 2023-12-27 ENCOUNTER — Inpatient Hospital Stay

## 2024-01-19 ENCOUNTER — Inpatient Hospital Stay

## 2024-01-20 ENCOUNTER — Inpatient Hospital Stay: Attending: Oncology

## 2024-01-24 ENCOUNTER — Inpatient Hospital Stay: Attending: Oncology

## 2024-01-27 DIAGNOSIS — E7801 Familial hypercholesterolemia: Secondary | ICD-10-CM | POA: Diagnosis not present

## 2024-01-27 DIAGNOSIS — D508 Other iron deficiency anemias: Secondary | ICD-10-CM | POA: Diagnosis not present

## 2024-01-27 DIAGNOSIS — R7309 Other abnormal glucose: Secondary | ICD-10-CM | POA: Diagnosis not present

## 2024-01-27 DIAGNOSIS — R635 Abnormal weight gain: Secondary | ICD-10-CM | POA: Diagnosis not present

## 2024-01-27 DIAGNOSIS — M2550 Pain in unspecified joint: Secondary | ICD-10-CM | POA: Diagnosis not present

## 2024-01-27 DIAGNOSIS — E538 Deficiency of other specified B group vitamins: Secondary | ICD-10-CM | POA: Diagnosis not present

## 2024-01-27 DIAGNOSIS — E559 Vitamin D deficiency, unspecified: Secondary | ICD-10-CM | POA: Diagnosis not present

## 2024-01-27 DIAGNOSIS — Z0000 Encounter for general adult medical examination without abnormal findings: Secondary | ICD-10-CM | POA: Diagnosis not present

## 2024-02-20 ENCOUNTER — Inpatient Hospital Stay

## 2024-02-21 ENCOUNTER — Inpatient Hospital Stay: Attending: Oncology

## 2024-03-16 ENCOUNTER — Other Ambulatory Visit

## 2024-03-19 ENCOUNTER — Inpatient Hospital Stay: Attending: Oncology

## 2024-03-20 ENCOUNTER — Telehealth: Payer: Self-pay | Admitting: Oncology

## 2024-03-20 NOTE — Telephone Encounter (Signed)
 Pt no showed labs prior on 11/17. Pt was scheduled for MD/iron  on 11/19.  I called and left vm for pt that due to not going for the lab appt, the appts on 11/19 have been canceled and pt can call the scheduling number (provided) to r/s all appts

## 2024-03-21 ENCOUNTER — Inpatient Hospital Stay: Admitting: Oncology

## 2024-03-21 ENCOUNTER — Inpatient Hospital Stay
